# Patient Record
Sex: Male | Born: 1975 | Race: Black or African American | Hispanic: No | Marital: Married | Smoking: Never smoker
Health system: Southern US, Community
[De-identification: ages and names within clinical notes are randomized; demographics above are authoritative.]

---

## 2006-01-19 ENCOUNTER — Emergency Department (HOSPITAL_COMMUNITY): Admission: EM | Admit: 2006-01-19 | Discharge: 2006-01-19 | Payer: Self-pay | Admitting: Emergency Medicine

## 2006-11-03 ENCOUNTER — Emergency Department (HOSPITAL_COMMUNITY): Admission: EM | Admit: 2006-11-03 | Discharge: 2006-11-03 | Payer: Self-pay | Admitting: Family Medicine

## 2009-05-05 ENCOUNTER — Emergency Department (HOSPITAL_COMMUNITY): Admission: EM | Admit: 2009-05-05 | Discharge: 2009-05-05 | Payer: Self-pay | Admitting: Emergency Medicine

## 2009-05-14 ENCOUNTER — Emergency Department (HOSPITAL_COMMUNITY): Admission: EM | Admit: 2009-05-14 | Discharge: 2009-05-14 | Payer: Self-pay | Admitting: Family Medicine

## 2010-06-23 ENCOUNTER — Ambulatory Visit: Payer: Self-pay | Admitting: Family Medicine

## 2010-06-23 DIAGNOSIS — E669 Obesity, unspecified: Secondary | ICD-10-CM

## 2010-10-30 ENCOUNTER — Emergency Department (HOSPITAL_COMMUNITY)
Admission: EM | Admit: 2010-10-30 | Discharge: 2010-10-30 | Payer: Self-pay | Source: Home / Self Care | Admitting: Emergency Medicine
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
## 2010-12-29 ENCOUNTER — Encounter: Payer: Self-pay | Admitting: *Deleted

## 2011-01-25 ENCOUNTER — Encounter: Payer: Self-pay | Admitting: Family Medicine

## 2011-01-25 ENCOUNTER — Ambulatory Visit (INDEPENDENT_AMBULATORY_CARE_PROVIDER_SITE_OTHER): Payer: Commercial Managed Care - PPO | Admitting: Family Medicine

## 2011-01-25 DIAGNOSIS — J301 Allergic rhinitis due to pollen: Secondary | ICD-10-CM | POA: Insufficient documentation

## 2011-01-25 DIAGNOSIS — J309 Allergic rhinitis, unspecified: Secondary | ICD-10-CM

## 2011-01-25 DIAGNOSIS — Z9109 Other allergy status, other than to drugs and biological substances: Secondary | ICD-10-CM | POA: Insufficient documentation

## 2011-01-25 MED ORDER — FLUTICASONE PROPIONATE 50 MCG/ACT NA SUSP: 2.0000 | Freq: Every day | NASAL | Status: DC | PRN

## 2011-01-25 NOTE — Progress Notes (Signed)
  Subjective:    Patient ID: Edward Lane, male    DOB: 1976/08/20, 35 y.o.   MRN: [id number]  HPI   Every year approx this time has current symptoms of  nasal drainange, itchy eyes, +sneeze and mild productive cough which last for approx 2-3 weeks, in the past he has tried claritin, zyrtec, nyquil , sudafed which have not helped, he wanted to try allergy shots. Nasal drainage has been the worst " feels like a faucet"  This week used Sudafed which helped a lot, claritin and Ibuprofen for a HA,  Food allergies- would like a referral to be tested for allergies, he has noticed throat tightness and "weird" sensation if he eats apples or almonds, concerned about other allergies       Review of Systems  No fever, no emesis, no CP, no HA, no change in stools, no rash, no eye drainage, +sick contacts with children- URI/Bronchitis, OM     Objective:   Physical Exam  GEN- NAD, sounds hoarse, alert and oriented  HEENT- mild injection conjunctiva bilat, no cobblestoning, no discharge from eyes, PERRL, EOMI, enlarged pale turbinates- clear discharge, MMM, noted rough textured white mucosa bilat cheeks (pt chews cheek), fair dentition, no erythema oropharynx, TM clear bilat no fluid noted  Neck- small node anterior cervical CVS- RRR, no murmur RESP- CTAB Pulse- 2+       Assessment & Plan:

## 2011-01-25 NOTE — Assessment & Plan Note (Signed)
While he may have some viral URI intermixed, current symptoms more suggest of allergic rhinitis based on exam. Will start nasal corticosteroid as pt has not tried in the past RTC if no improvement prior to allergist visit

## 2011-01-25 NOTE — Assessment & Plan Note (Signed)
Will send for formal testing with allergist, pt has not had any recent difficulties with foods as he avoids apples,etc. Has not given history of any true anaphylaxtic events.

## 2011-01-25 NOTE — Patient Instructions (Signed)
Use the flonase as needed daily for allergic rhinitis You can use Sudafed, but do not use more than 3 days in a row We will call you with the allergist appt Please return if you do not improve

## 2011-02-07 ENCOUNTER — Encounter: Payer: Self-pay | Admitting: Family Medicine

## 2011-02-07 ENCOUNTER — Ambulatory Visit (INDEPENDENT_AMBULATORY_CARE_PROVIDER_SITE_OTHER): Payer: Commercial Managed Care - PPO | Admitting: Family Medicine

## 2011-02-07 DIAGNOSIS — J309 Allergic rhinitis, unspecified: Secondary | ICD-10-CM

## 2011-02-07 DIAGNOSIS — J029 Acute pharyngitis, unspecified: Secondary | ICD-10-CM

## 2011-02-07 NOTE — Patient Instructions (Signed)
Your strep test was negative today. I think your cough is probably related to your allergies.  Stop taking the Flonase.  You can try something like a Neti Pot to help rinse out your sinuses - this may help both your nasal symptoms and your cough.  Follow up with your allergist as scheduled. I hope this helps with your symptoms.   - Dr. Wallene Huh    Allergic Rhinitis Allergic rhinitis is when the mucous membranes in the nose respond to allergens. Allergens are particles in the air that cause your body to have an allergic reaction. This causes you to release allergic antibodies. Through a chain of events, these eventually cause you to release histamine into the blood stream (hence the use of antihistamines). Although meant to be protective to the body, it is this release that causes your discomfort, such as frequent sneezing, congestion and an itchy runny nose.  CAUSES The pollen allergens may come from grasses, trees, and weeds. This is seasonal allergic rhinitis, or "hay fever." Other allergens cause year-round allergic rhinitis (perennial allergic rhinitis) such as house dust mite allergen, pet dander and mold spores.  SYMPTOMS  Nasal stuffiness (congestion).   Runny, itchy nose with sneezing and tearing of the eyes.   There is often an itching of the mouth, eyes and ears.  It cannot be cured, but it can be controlled with medications. DIAGNOSIS If you are unable to determine the offending allergen, skin or blood testing may find it. TREATMENT  Avoid the allergen.   Medications and allergy shots (immunotherapy) can help.   Hay fever may often be treated with antihistamines in pill or nasal spray forms. Antihistamines block the effects of histamine. There are over-the-counter medicines that may help with nasal congestion and swelling around the eyes. Check with your caregiver before taking or giving this medicine.  If the treatment above does not work, there are many new medications your  caregiver can prescribe. Stronger medications may be used if initial measures are ineffective. Desensitizing injections can be used if medications and avoidance fails. Desensitization is when a patient is given ongoing shots until the body becomes less sensitive to the allergen. Make sure you follow up with your caregiver if problems continue. SEEK MEDICAL CARE IF:   You develop fever (more than 100.4F (38.1 C).   You develop a cough that does not stop easily (persistent).   You have shortness of breath.   You start wheezing.   Symptoms interfere with normal daily activities.  Document Released: 07/17/2001 Document Re-Released: 11/13/2009 [facility] Patient Information 2011 [place], [place].

## 2011-02-07 NOTE — Assessment & Plan Note (Signed)
Likely cause of sore throat. Has failed multiple OTC therapies, did not tolerate Flonase. Advised follow up with allergist. Advised nasal lavage. Advised chloraseptic for sore throat. Handout given. Follow up as needed.

## 2011-02-07 NOTE — Progress Notes (Signed)
  Subjective:    Patient ID: Edward Lane, male    DOB: 12-19-1975, 35 y.o.   MRN: [id number]  Sore Throat   Seen on 3/22 bu Dr. Jeanice Lim for allergic rhinitis symptoms - started on Flonase, scheduled for appointment with allergist due to history of seasonal allergies and question of food allergies (set up for 02/08/11). History of seasonal allergies chronically. Symptoms started about 3 weeks ago - nasal drainage and congestion, itchy eyes, cough, sore throat. Had tried Claritin, Zyrtec, Sudafed, Nyquil without relief. Tried Flonase prescribed by Dr. Jeanice Lim - states that it caused headache. Reports post-nasal drip. Denies heartburn, sour taste in mouth, chest pain, LE edema, fever, chills, nausea, emesis, diarrhea, body aches, lymphadenopathy. Reports +ve sick contacts (kids with URI sx).    Review of Systems as per HPI otherwise negative      Objective:   Physical Exam General: Pleasant, NAD, hoarse and congested sounding  Head: No sinus tenderness to palpation.  Eyes: Conjunctival injection bilaterally with tearing  Ears: Tympanic membranes clear bilaterally Nose: enlarged pale turbinates; congestion; clear discharge' Mouth: Moist membranes; mildly irritated buccal mucosa;  fair dentition; mild erythema without cobblestoning,  Neck: No anterior lymphadenopathy  Pulmonary: Clear to auscultation bilaterally without wheeze or stridor        Assessment & Plan:

## 2011-08-08 ENCOUNTER — Ambulatory Visit (INDEPENDENT_AMBULATORY_CARE_PROVIDER_SITE_OTHER): Payer: Commercial Managed Care - PPO | Admitting: Family Medicine

## 2011-08-08 VITALS — BP 113/78 | HR 92 | Temp 98.2°F | Wt 248.5 lb

## 2011-08-08 DIAGNOSIS — M62838 Other muscle spasm: Secondary | ICD-10-CM

## 2011-08-08 MED ORDER — KETOROLAC TROMETHAMINE 60 MG/2ML IM SOLN
60.0000 mg | Freq: Once | INTRAMUSCULAR | Status: AC
  Administered 2011-08-08: 60 mg via INTRAMUSCULAR

## 2011-08-08 MED ORDER — CYCLOBENZAPRINE HCL 5 MG PO TABS: ORAL_TABLET | ORAL | Status: DC

## 2011-08-08 MED ORDER — MELOXICAM 15 MG PO TABS: 15.0000 mg | ORAL_TABLET | Freq: Every day | ORAL | Status: DC

## 2011-08-08 NOTE — Progress Notes (Signed)
  Subjective:    Patient ID: Edward Lane, male    DOB: Mar 08, 1976, 35 y.o.   MRN: [id number]  HPI Lower back/gluteal pain x 2 days. Pt works at [employer] ,stands on [employer] the day. Pt states that he noticed low back/gluteal pain earlier in the day yesterday. Pt states that pain progressively worsended throughout the day to the point where he could not sit down or sleep last night. Pain worse with sitting and back flexion. No fever, rash, dysuria. Pain has some radiation down legs bilaterally. Pt states that this has been a recurrent issue with pt having similar episodes in neck and back. Pt does report decreased po intake secondary to work environment.   Review of Systems See HPI    Objective:   Physical Exam Gen: up in chair, NAD ABD: S/NT/+ bowel sounds  EXT: 2+ peripheral pulses MSK: + TTP in R gluteal region, + Pain in lower back with internal/external rotation of hips bilaterally.     Assessment & Plan:

## 2011-08-08 NOTE — Patient Instructions (Addendum)
It was good to meet you today I am starting you on a long acting NSAID for your pain  You can also use high dose tylenol (max of 3g daily) throughout the day as needed I am referring you to physical therapy as this has been a recurrent issue Call is you have any questions or concerns, or if your symptoms don't improve God Bless,  Doree Albee MD  Muscle Cramps Muscle cramps are due to sudden involuntary muscle contraction. This means you have no control over the tightening of a muscle (or muscles). Often there are no obvious causes. Muscle cramps may occur with over-exertion. They may also occur with chilling of the muscles. An example of a muscle chilling activity is swimming. It is uncommon for cramps to be due to a serious underlying disorder. In most cases, muscle cramps improve (or leave) within minutes. CAUSES Some common causes are due to:  Injury.   Infections, especially viral.   Abnormal levels of the salts and ions in your blood (electrolytes). This could happen if you are taking water pills (diuretics).   Blood vessel disease where not enough blood is getting to the muscles (intermittent claudication).  SOME UNCOMMON CAUSES OF MUSCLE CRAMPS ARE:  Side effects of some medicine (such as lithium).   Alcohol abuse.   Diseases where there is soreness (inflammation) of the muscular system.  HOME CARE INSTRUCTIONS  It may be helpful to massage, stretch, and relax the affected muscle.   Taking a dose of over-the-counter Benadryl (dephenhydramine) is helpful for night leg cramps.   Tonic water that contains quinine may be helpful.  SEEK MEDICAL CARE IF:  Cramps are frequent and not relieved with medicine.  MAKE SURE YOU:   Understand these instructions.   Will watch your condition.   Will get help right away if you are not doing well or get worse.  Document Released: 04/13/2002 Document Re-Released: 01/18/2009 [facility] Patient Information 2011 [place], [place].

## 2011-08-08 NOTE — Assessment & Plan Note (Addendum)
Lower back muscle spasm with major piriformis involvement. toradol 60 in clinic today. Will start pt on mobic with prn tylenol. Prn flexeril at night for recalcitrant spasm. Given that this has been a recurrent issue, will also set up pt for physical therapy. Handout given. Red flags for return discussed.

## 2012-02-18 ENCOUNTER — Ambulatory Visit (INDEPENDENT_AMBULATORY_CARE_PROVIDER_SITE_OTHER): Payer: Commercial Managed Care - PPO | Admitting: Family Medicine

## 2012-02-18 ENCOUNTER — Encounter: Payer: Self-pay | Admitting: Family Medicine

## 2012-02-18 VITALS — BP 116/80 | HR 64 | Temp 97.9°F | Ht 72.0 in | Wt 256.0 lb

## 2012-02-18 DIAGNOSIS — J301 Allergic rhinitis due to pollen: Secondary | ICD-10-CM

## 2012-02-18 MED ORDER — CETIRIZINE HCL 10 MG PO TABS: 10.0000 mg | ORAL_TABLET | Freq: Every day | ORAL | Status: DC

## 2012-02-18 MED ORDER — FLUTICASONE PROPIONATE 50 MCG/ACT NA SUSP: 2.0000 | Freq: Every day | NASAL | Status: DC

## 2012-02-18 NOTE — Patient Instructions (Signed)
It was nice meeting you today.  Your symptoms are most likely due to seasonal allergies. I don't see any sign of infection.  You can try taking zyrtec or cetirizine, once daily at nighttime. I am also going to send a prescription for flonase which is a nasal steroid. You can use the nasal saline first, then use the nasal steroid after that.   If you start having fevers, chills, worsening symptoms, please come back to the clinic to make sure that there is not a superimposed infection.

## 2012-02-19 NOTE — Assessment & Plan Note (Signed)
No evidence of infection at this time. Patient's rhinorrhea and itchy eyes are most likely due to seasonal allergies. Recommended zyrtec at night time and prescribed flonase. Also recommended saline nasal solution for irrigation.  Went over red flags (see AVS) and gave handout on allergic rhinitis.

## 2012-02-19 NOTE — Progress Notes (Signed)
Patient ID: Edward Lane, male   DOB: 1976/10/19, 36 y.o.   MRN: [id number] Patient ID: Edward Lane    DOB: 10/25/1976, 36 y.o.   MRN: [id number] --- Subjective:  Edward Lane is a 36 y.o.male who presents with seasonal allergy symptoms x5 days. He complains of rhinorrhea, itchy eyes, headache around eyes, and an occasional cough. He denies any fever or chills, sore throat, ear pain, or sick contacts. He denies tobacco use or exposure. He has been using a multi-allergy medicine that contains Tylenol and Sudafed and feels like he has had mild relief. This is a recurrent problem. He has had seasonal allergies in the past mostly in the spring and fall around the change in season. He has tried Musician and Benadryl which all did not work. He has never tried Zyrtec or nasal sprays.   Objective: Filed Vitals:   02/18/12 1024  BP: 116/80  Pulse: 64  Temp: 97.9 F (36.6 C)    Physical Examination:   General appearance - alert, well appearing, and in no distress Ears - right TM slightly duller than left, but no pus or erythema observed.  Nose - inflamed nasal turbinates Mouth - mucous membranes moist, pharynx normal without lesions Neck - supple, no significant adenopathy Chest - clear to auscultation, no wheezes, rales or rhonchi, symmetric air entry Heart - normal rate, regular rhythm, normal S1, S2, no murmurs, rubs, clicks or gallops

## 2012-11-07 ENCOUNTER — Encounter: Payer: Commercial Managed Care - PPO | Admitting: Family Medicine

## 2013-03-03 ENCOUNTER — Emergency Department (HOSPITAL_COMMUNITY)
Admission: EM | Admit: 2013-03-03 | Discharge: 2013-03-03 | Disposition: A | Discharge status: Home / Self Care | Payer: No Typology Code available for payment source | Attending: Emergency Medicine | Admitting: Emergency Medicine

## 2013-03-03 ENCOUNTER — Encounter (HOSPITAL_COMMUNITY): Payer: Self-pay | Admitting: Emergency Medicine

## 2013-03-03 DIAGNOSIS — M549 Dorsalgia, unspecified: Secondary | ICD-10-CM

## 2013-03-03 DIAGNOSIS — Y9389 Activity, other specified: Secondary | ICD-10-CM | POA: Insufficient documentation

## 2013-03-03 DIAGNOSIS — M5380 Other specified dorsopathies, site unspecified: Secondary | ICD-10-CM | POA: Insufficient documentation

## 2013-03-03 DIAGNOSIS — IMO0002 Reserved for concepts with insufficient information to code with codable children: Secondary | ICD-10-CM | POA: Insufficient documentation

## 2013-03-03 DIAGNOSIS — M62830 Muscle spasm of back: Secondary | ICD-10-CM

## 2013-03-03 DIAGNOSIS — Y92410 Unspecified street and highway as the place of occurrence of the external cause: Secondary | ICD-10-CM | POA: Insufficient documentation

## 2013-03-03 MED ORDER — CYCLOBENZAPRINE HCL 10 MG PO TABS: 10.0000 mg | ORAL_TABLET | Freq: Two times a day (BID) | ORAL | Status: DC | PRN

## 2013-03-03 MED ORDER — IBUPROFEN 800 MG PO TABS
800.0000 mg | ORAL_TABLET | Freq: Once | ORAL | Status: AC
  Administered 2013-03-03: 800 mg via ORAL
  Filled 2013-03-03: qty 1

## 2013-03-03 MED ORDER — IBUPROFEN 800 MG PO TABS: 800.0000 mg | ORAL_TABLET | Freq: Three times a day (TID) | ORAL | Status: DC

## 2013-03-03 MED ORDER — CYCLOBENZAPRINE HCL 10 MG PO TABS
10.0000 mg | ORAL_TABLET | Freq: Once | ORAL | Status: AC
  Administered 2013-03-03: 10 mg via ORAL
  Filled 2013-03-03: qty 1
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
## 2013-03-04 ENCOUNTER — Ambulatory Visit (INDEPENDENT_AMBULATORY_CARE_PROVIDER_SITE_OTHER): Payer: BC Managed Care – PPO | Admitting: Family Medicine

## 2013-03-04 ENCOUNTER — Encounter: Payer: Self-pay | Admitting: Family Medicine

## 2013-03-04 VITALS — BP 130/91 | HR 70 | Temp 98.0°F | Ht 72.0 in | Wt 250.0 lb

## 2013-03-04 DIAGNOSIS — M542 Cervicalgia: Secondary | ICD-10-CM | POA: Insufficient documentation

## 2013-03-04 DIAGNOSIS — E669 Obesity, unspecified: Secondary | ICD-10-CM

## 2013-03-04 NOTE — Patient Instructions (Signed)
Based off your exam and history I do not think you need cervical films today. Continue medications as prescribed by ED. Continue range of motion exercises. Massage by your wife would be helpful as well.   These would be reasons to return: weakness in arm or legs, bowel or bladder incontinence, numbness/tingling in hands or legs, worsening pain.   Follow up within the month with Dr. Mauricio Po for yearly physical and to follow up blood pressure,  Dr. Durene Cal  Health Maintenance Due-looks like tetanus is the only thing you need  Topic Date Due  . Tetanus/tdap  05/12/1994  . Influenza Vaccine  07/06/2013
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
## 2013-03-05 ENCOUNTER — Telehealth: Payer: Self-pay | Admitting: *Deleted

## 2013-03-05 NOTE — ED Provider Notes (Signed)
I have personally seen and examined the patient.  I have discussed the plan of care with the resident.  I have reviewed the documentation on PMH/FH/Soc. History.  I have reviewed the documentation of the resident and agree.  Amyia Lodwick, MD 03/05/13 1440 
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
## 2013-03-13 ENCOUNTER — Ambulatory Visit (INDEPENDENT_AMBULATORY_CARE_PROVIDER_SITE_OTHER): Payer: BC Managed Care – PPO | Admitting: Family Medicine

## 2013-03-13 VITALS — BP 116/81 | HR 73 | Temp 98.3°F | Ht 72.0 in | Wt 253.0 lb

## 2013-03-13 DIAGNOSIS — M542 Cervicalgia: Secondary | ICD-10-CM
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
## 2013-03-13 NOTE — Patient Instructions (Signed)
Things are looking better today. I am glad your range of motion is improving. Keep moving!  See me or Dr. Mauricio Po in 2 weeks to follow up to reassess to see if we can get you back to full time.   Thanks, Dr. Durene Cal

## 2013-03-16 ENCOUNTER — Encounter: Payer: Self-pay | Admitting: Family Medicine
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
## 2013-03-27 ENCOUNTER — Ambulatory Visit (INDEPENDENT_AMBULATORY_CARE_PROVIDER_SITE_OTHER): Payer: BC Managed Care – PPO | Admitting: Family Medicine

## 2013-03-27 ENCOUNTER — Encounter: Payer: Self-pay | Admitting: Family Medicine

## 2013-03-27 VITALS — BP 125/88 | HR 76 | Ht 72.0 in | Wt 251.0 lb

## 2013-03-27 DIAGNOSIS — R51 Headache: Secondary | ICD-10-CM

## 2013-03-27 DIAGNOSIS — R519 Headache, unspecified: Secondary | ICD-10-CM | POA: Insufficient documentation

## 2013-03-27 MED ORDER — NORTRIPTYLINE HCL 50 MG PO CAPS: 50.0000 mg | ORAL_CAPSULE | Freq: Every day | ORAL | Status: DC
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
## 2013-03-27 NOTE — Patient Instructions (Addendum)
It was a pleasure to see you today.    For your headache, I am changing your medicine (stop the Flexeril) to NORTRIPTYLINE 50MG  ONE TIME DAILY AT BEDTIME. This should help you sleep well, and help reduce the intensity and frequency of the headache.   I would like to see you back in 2 weeks for follow up.  I have written a letter for your work today; will update at next visit.
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
## 2013-04-10 ENCOUNTER — Ambulatory Visit (INDEPENDENT_AMBULATORY_CARE_PROVIDER_SITE_OTHER): Payer: BC Managed Care – PPO | Admitting: Family Medicine

## 2013-04-10 ENCOUNTER — Encounter: Payer: Self-pay | Admitting: Family Medicine

## 2013-04-10 VITALS — BP 122/81 | HR 74 | Ht 72.0 in | Wt 260.0 lb

## 2013-04-10 DIAGNOSIS — M545 Low back pain, unspecified: Secondary | ICD-10-CM

## 2013-04-10 DIAGNOSIS — R51 Headache: Secondary | ICD-10-CM
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
## 2013-04-10 NOTE — Progress Notes (Signed)
  Subjective:    Patient ID: Edward Lane, male    DOB: June 07, 1976, 37 y.o.   MRN: [id number]  HPI Here for followup on headaches.  Since stopping the flexeril and starting the nortriptyline, he has slept better.  If he takes the nortriptyline too late (around 11pm), he will have after-effects in the morning at [facility] when he wakes up.  The intensity of headaches is improving. May have one headache a day, tolerable.  Not taking otc pain relievers for this.  No associated photophobia or phonophobia; no nausea or vomiting.  Headache used to be on the L parietal area, now moved to the right.  Not temporal, no associated ear pain or frontal/maxillary pain.  He continues to work 4 hour shifts, has been asked to come back full-time (8 hours at a time).  Believes he would do better to ease back into his full time routine with a transition to 6-hour shifts first for 2 weeks.  At work he has significant lower back pain after 4 hours.   Review of Systems See above    Objective:   Physical Exam Well appearing, no apparent distress HEENT Neck supple. TMs clear bilat.  No findings on examination of scalp.  EOMI.  PERRL.  No frontal or maxillary sinus tenderness. No cervical adenopathy noted.  MSK: No point tenderness over vertebral processes. Mild paraspinous tenderness in lumbar region bilaterally. Gait unremarkable.        Assessment & Plan:

## 2013-04-10 NOTE — Assessment & Plan Note (Signed)
Improving in frequency and intensity since starting nortriptyline 50mg  nightly. He admits that he does not take it every night, perhaps 4 out of 7 nights.  Plans to reduce/eliminate the med as his headaches dissipate.  To call for further evaluation if HAs get worse/change in character.  He is not using OTC pain relievers, we discussed 'medication overuse headaches" and he does not appear to be at risk for this now.

## 2013-04-10 NOTE — Patient Instructions (Signed)
It was a pleasure to see you today.    For the headaches, we will allow the nortriptyline to run out as your needs for it diminish.  If your headaches come back or change in character, please be in touch with me.   Note for employer regarding increasing work hours before going back to full time.

## 2013-05-05 ENCOUNTER — Ambulatory Visit (INDEPENDENT_AMBULATORY_CARE_PROVIDER_SITE_OTHER): Payer: BC Managed Care – PPO | Admitting: Family Medicine

## 2013-05-05 ENCOUNTER — Encounter: Payer: Self-pay | Admitting: Family Medicine

## 2013-05-05 VITALS — BP 117/81 | HR 74 | Ht 72.0 in | Wt 256.0 lb

## 2013-05-05 DIAGNOSIS — R51 Headache: Secondary | ICD-10-CM
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
## 2013-05-05 NOTE — Patient Instructions (Addendum)
It was a pleasure to see you today.  Please take the nortriptyline 50mg  capsules, one before bedtime, every night until I see you back in 2 weeks.   I am ordering a CT scan of the head to be sure we are not missing another cause.   I would like to see you back in the office in about 2 weeks' time.   Note for your work place.

## 2013-05-11 ENCOUNTER — Telehealth: Payer: Self-pay | Admitting: Family Medicine

## 2013-05-11 NOTE — Telephone Encounter (Signed)
Called pt. He does not know if he needs PA, or where to call. He said to set up his

## 2013-05-11 NOTE — Telephone Encounter (Signed)
Pt is returning call about pre-auth on CT JW
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
## 2013-05-12 NOTE — Telephone Encounter (Signed)
Left detailed message on patients voicemail. Draylen Lobue S  

## 2013-05-14 ENCOUNTER — Telehealth: Payer: Self-pay | Admitting: Family Medicine

## 2013-05-14 NOTE — Telephone Encounter (Signed)
Pt is calling to let Dr. Mauricio Po know that the medication he is taking has left him with some side effects so that he has not been to work since this past Tuesday. He is wondering what he should do. He would like to talk to Dr. Mauricio Po or a nurse on how to proceed. JW

## 2013-05-14 NOTE — Telephone Encounter (Signed)
Called pt and he reports that he has not been working since 05/11/13 due to feeling drowsy from taking the nortriptyline at night. It has helped some with his head aches, but he can't work due to feeling drowsy. Pt supposed to take it for 2 weeks. His question is: should he stop the nortriptyline so he can go back to work, or should he stay out until he sees Dr.Breen again on 05/22/13. Pt has appt for CT scan tomorrow. Will fwd. To Dr.Breen for review. Lorenda Hatchet, Renato Battles

## 2013-05-14 NOTE — Telephone Encounter (Signed)
called pt.lmvm to schedule OV with Dr.Breen. See last OV (needs to f/up)

## 2013-05-15 ENCOUNTER — Ambulatory Visit
Admission: RE | Admit: 2013-05-15 | Discharge: 2013-05-15 | Disposition: A | Discharge status: Home / Self Care | Payer: No Typology Code available for payment source | Source: Ambulatory Visit | Attending: Family Medicine | Admitting: Family Medicine

## 2013-05-15 DIAGNOSIS — R51 Headache: Secondary | ICD-10-CM

## 2013-05-15 NOTE — Telephone Encounter (Signed)
LMVM to call back. Please see Dr.Breen's message. Thank you. Lorenda Hatchet, Renato Battles

## 2013-05-15 NOTE — Telephone Encounter (Signed)
I recommend he take the nortriptyline 1-2 hours earlier each night, for the next 2-3 nights, to see if the effects of sleepiness wear off in the mornings.  If this does not help, he may hold the medicine until our visit, at which time we can consider other medication options (such as Inderal).  I will be on the lookout for his CT results. JB

## 2013-05-19 ENCOUNTER — Telehealth: Payer: Self-pay | Admitting: *Deleted

## 2013-05-19 NOTE — Telephone Encounter (Signed)
Pt called and I explained Dr.Breen's message to him. He verbalized understanding. He said, that he would d/c the nortriptyline because it is not working for him due to being unable to work.  Pt request to be written out of work for 7/8, 7/9, 7/10, 7/11, 7/14 and 7/15. He needs a note stating that he can go back to work 7/16. Pt also request for Dr.Breen to call him re: CT scan, today if possible. I told the pt that I would send his request to Dr.Breen. Lorenda Hatchet, Renato Battles

## 2013-05-20 ENCOUNTER — Telehealth: Payer: Self-pay | Admitting: Family Medicine

## 2013-05-20 NOTE — Telephone Encounter (Signed)
Pt says he received a phone message from dr breen saying for him to call dr back about CT scan that was done last Friday. No phone notes in file indicating pt was called. Pt has appt with dr Mauricio Po on Friday July 18. Please advise

## 2013-05-20 NOTE — Telephone Encounter (Signed)
I called patient back to cell phone, left voice message.  I left him the message that his CT scan does not show abnormalities to explain HA.  I also left the message that I wish to understand better the issue regarding his request for a work excuse, asked that he call me back. JB

## 2013-05-20 NOTE — Telephone Encounter (Signed)
Will fwd to Md.  Thurley Francesconi L, CMA  
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
## 2013-05-22 ENCOUNTER — Ambulatory Visit (INDEPENDENT_AMBULATORY_CARE_PROVIDER_SITE_OTHER): Payer: BC Managed Care – PPO | Admitting: Family Medicine

## 2013-05-22 ENCOUNTER — Encounter: Payer: Self-pay | Admitting: Family Medicine

## 2013-05-22 VITALS — BP 118/85 | HR 101 | Temp 98.4°F | Ht 72.0 in | Wt 258.9 lb

## 2013-05-22 DIAGNOSIS — R51 Headache: Secondary | ICD-10-CM

## 2013-05-22 DIAGNOSIS — M542 Cervicalgia: Secondary | ICD-10-CM

## 2013-05-22 NOTE — Patient Instructions (Addendum)
It was a pleasure to see you again today.   For your neck pain/headache, we will hold all regular medicine for this.  Start with Physical Therapy and Massage Therapy.   A cervical x-ray was ordered today as well.  I would like to see you back in the coming month for follow up.   Letter for work was written.
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
## 2013-05-25 ENCOUNTER — Ambulatory Visit
Admission: RE | Admit: 2013-05-25 | Discharge: 2013-05-25 | Disposition: A | Discharge status: Home / Self Care | Payer: No Typology Code available for payment source | Source: Ambulatory Visit | Attending: Family Medicine | Admitting: Family Medicine

## 2013-05-25 ENCOUNTER — Encounter: Payer: Self-pay | Admitting: Family Medicine

## 2013-05-25 DIAGNOSIS — M542 Cervicalgia: Secondary | ICD-10-CM

## 2013-06-01 ENCOUNTER — Ambulatory Visit: Payer: Self-pay | Admitting: Physical Therapy

## 2013-06-01 ENCOUNTER — Ambulatory Visit: Payer: No Typology Code available for payment source | Attending: Family Medicine | Admitting: Physical Therapy

## 2013-06-01 DIAGNOSIS — R51 Headache: Secondary | ICD-10-CM | POA: Insufficient documentation

## 2013-06-01 DIAGNOSIS — M542 Cervicalgia: Secondary | ICD-10-CM | POA: Insufficient documentation

## 2013-06-01 DIAGNOSIS — IMO0001 Reserved for inherently not codable concepts without codable children: Secondary | ICD-10-CM | POA: Insufficient documentation

## 2013-06-02 ENCOUNTER — Telehealth: Payer: Self-pay | Admitting: Family Medicine

## 2013-06-02 NOTE — Telephone Encounter (Signed)
Called pt. He reports that his physical therapist told him, if she works on his neck and if he still does his job (which requires to hold down his neck) the PT would not work, it would be counter -productive. Pt has to avoid holding down his neck.  I told the pt, that I don't think, that Dr.Breen would write him out of work while in PT. Pt said, that maybe he needs a letter for restrictions on his job.    We  also need for the PT to send her recommendations to Dr.Breen. Pt said, that Dr.Breen should have the report already. Lorenda Hatchet, Renato Battles

## 2013-06-02 NOTE — Telephone Encounter (Signed)
Patient is calling about Physical Therapy starting yesterday and the recommendations for work.

## 2013-06-02 NOTE — Telephone Encounter (Signed)
I did not see any PT notes in the Epic chart, and I did not have any PT paper notes in my paper mailbox earlier today.  I will need these recs from his treating physical therapist in order to write a letter for work restrictions.  Paula Compton, MD

## 2013-06-03 NOTE — Telephone Encounter (Signed)
Called patient and told him he needs to have the records faxed to [place] form PT for Dr Mauricio Po to review before he would consider writing a work restrictions letter.[name], [name]

## 2013-06-03 NOTE — Telephone Encounter (Signed)
I received a report from the [facility] Physical Therapy department, which they completed after his evaluation on July 28th, 2014.  There is no mention of having him restrict his work activities as part of the therapeutic plan. JB

## 2013-06-04 ENCOUNTER — Ambulatory Visit: Payer: No Typology Code available for payment source | Admitting: Physical Therapy

## 2013-06-04 ENCOUNTER — Telehealth: Payer: Self-pay | Admitting: Family Medicine

## 2013-06-04 NOTE — Telephone Encounter (Signed)
Pt came by. He is doing PT.  Dr Mauricio Po was waiting on recommendation from physical therapist in regards to work. PT: Boneta Lucks at [facility]. Boneta Lucks is not sure what her role and what she can say in her recommendation. Please have Dr Mauricio Po contact Boneta Lucks.

## 2013-06-08 ENCOUNTER — Ambulatory Visit: Payer: No Typology Code available for payment source | Attending: Family Medicine | Admitting: Physical Therapy

## 2013-06-08 DIAGNOSIS — R51 Headache: Secondary | ICD-10-CM | POA: Insufficient documentation

## 2013-06-08 DIAGNOSIS — IMO0001 Reserved for inherently not codable concepts without codable children: Secondary | ICD-10-CM | POA: Insufficient documentation

## 2013-06-08 DIAGNOSIS — M542 Cervicalgia: Secondary | ICD-10-CM | POA: Insufficient documentation

## 2013-06-09 ENCOUNTER — Ambulatory Visit: Payer: No Typology Code available for payment source | Admitting: Physical Therapy

## 2013-06-11 ENCOUNTER — Ambulatory Visit: Payer: No Typology Code available for payment source | Admitting: Physical Therapy

## 2013-06-15 ENCOUNTER — Ambulatory Visit: Payer: No Typology Code available for payment source | Admitting: Physical Therapy

## 2013-06-16 ENCOUNTER — Encounter: Payer: Self-pay | Admitting: Physical Therapy

## 2013-06-18 ENCOUNTER — Ambulatory Visit: Payer: No Typology Code available for payment source

## 2013-06-18 ENCOUNTER — Ambulatory Visit: Payer: No Typology Code available for payment source | Admitting: Physical Therapy

## 2013-08-07 ENCOUNTER — Ambulatory Visit: Payer: Self-pay | Admitting: Family Medicine

## 2013-08-08 ENCOUNTER — Emergency Department (INDEPENDENT_AMBULATORY_CARE_PROVIDER_SITE_OTHER)
Admission: EM | Admit: 2013-08-08 | Discharge: 2013-08-08 | Disposition: A | Discharge status: Nursing Home (Includes ICF) | Payer: BC Managed Care – PPO | Source: Home / Self Care | Attending: Family Medicine | Admitting: Family Medicine

## 2013-08-08 ENCOUNTER — Emergency Department (HOSPITAL_COMMUNITY): Payer: BC Managed Care – PPO

## 2013-08-08 ENCOUNTER — Inpatient Hospital Stay (HOSPITAL_COMMUNITY)
Admission: EM | Admit: 2013-08-08 | Discharge: 2013-08-09 | DRG: 178 | Disposition: A | Discharge status: Home / Self Care | Payer: BC Managed Care – PPO | Attending: Family Medicine | Admitting: Family Medicine

## 2013-08-08 ENCOUNTER — Other Ambulatory Visit: Payer: Self-pay

## 2013-08-08 ENCOUNTER — Encounter (HOSPITAL_COMMUNITY): Payer: Self-pay | Admitting: Emergency Medicine

## 2013-08-08 DIAGNOSIS — R112 Nausea with vomiting, unspecified: Secondary | ICD-10-CM | POA: Diagnosis present

## 2013-08-08 DIAGNOSIS — R1013 Epigastric pain: Secondary | ICD-10-CM | POA: Diagnosis present

## 2013-08-08 DIAGNOSIS — K269 Duodenal ulcer, unspecified as acute or chronic, without hemorrhage or perforation: Principal | ICD-10-CM | POA: Diagnosis present

## 2013-08-08 DIAGNOSIS — R1110 Vomiting, unspecified: Secondary | ICD-10-CM

## 2013-08-08 DIAGNOSIS — R52 Pain, unspecified: Secondary | ICD-10-CM

## 2013-08-08 DIAGNOSIS — K8020 Calculus of gallbladder without cholecystitis without obstruction: Secondary | ICD-10-CM | POA: Diagnosis present

## 2013-08-08 LAB — CBC WITH DIFFERENTIAL/PLATELET
Basophils Absolute: 0 10*3/uL (ref 0.0–0.1)
Basophils Relative: 0 % (ref 0–1)
Eosinophils Relative: 2 % (ref 0–5)
HCT: 39.2 % (ref 39.0–52.0)
MCH: 29.2 pg (ref 26.0–34.0)
MCHC: 36.5 g/dL — ABNORMAL HIGH (ref 30.0–36.0)
Monocytes Absolute: 0.6 10*3/uL (ref 0.1–1.0)
Neutro Abs: 6.6 10*3/uL (ref 1.7–7.7)
Neutrophils Relative %: 70 % (ref 43–77)
Platelets: 298 10*3/uL (ref 150–400)
RDW: 12.2 % (ref 11.5–15.5)

## 2013-08-08 LAB — HEPATIC FUNCTION PANEL
ALT: 17 U/L (ref 0–53)
Alkaline Phosphatase: 104 U/L (ref 39–117)
Total Protein: 7.9 g/dL (ref 6.0–8.3)

## 2013-08-08 LAB — BASIC METABOLIC PANEL
Calcium: 9.4 mg/dL (ref 8.4–10.5)
Chloride: 102 mEq/L (ref 96–112)
Creatinine, Ser: 0.94 mg/dL (ref 0.50–1.35)
GFR calc Af Amer: >90 mL/min (ref >90)
Sodium: 139 mEq/L (ref 135–145)

## 2013-08-08 LAB — OCCULT BLOOD, POC DEVICE: Fecal Occult Bld: NEGATIVE

## 2013-08-08 LAB — LIPASE, BLOOD: Lipase: 46 U/L (ref 11–59)

## 2013-08-08 MED ORDER — ONDANSETRON HCL 4 MG/2ML IJ SOLN
4.0000 mg | Freq: Once | INTRAMUSCULAR | Status: AC
  Administered 2013-08-08: 4 mg via INTRAVENOUS
  Filled 2013-08-08: qty 2

## 2013-08-08 MED ORDER — ONDANSETRON HCL 4 MG/2ML IJ SOLN: 4.0000 mg | Freq: Four times a day (QID) | INTRAMUSCULAR | Status: DC | PRN

## 2013-08-08 MED ORDER — GI COCKTAIL ~~LOC~~: 30.0000 mL | Freq: Once | ORAL | Status: DC

## 2013-08-08 MED ORDER — ONDANSETRON HCL 4 MG PO TABS: 4.0000 mg | ORAL_TABLET | Freq: Four times a day (QID) | ORAL | Status: DC | PRN

## 2013-08-08 MED ORDER — SODIUM CHLORIDE 0.9 % IV BOLUS (SEPSIS)
1000.0000 mL | Freq: Once | INTRAVENOUS | Status: AC
  Administered 2013-08-08: 1000 mL via INTRAVENOUS

## 2013-08-08 MED ORDER — IOHEXOL 300 MG/ML  SOLN
25.0000 mL | Freq: Once | INTRAMUSCULAR | Status: AC | PRN
  Administered 2013-08-08: 25 mL via ORAL

## 2013-08-08 MED ORDER — PANTOPRAZOLE SODIUM 40 MG IV SOLR
40.0000 mg | Freq: Two times a day (BID) | INTRAVENOUS | Status: DC
  Administered 2013-08-08 – 2013-08-09 (×2): 40 mg via INTRAVENOUS
  Filled 2013-08-08 (×3): qty 40

## 2013-08-08 MED ORDER — HYDROMORPHONE HCL PF 2 MG/ML IJ SOLN
2.0000 mg | Freq: Once | INTRAMUSCULAR | Status: AC
  Administered 2013-08-08: 2 mg via INTRAVENOUS
  Filled 2013-08-08: qty 1

## 2013-08-08 MED ORDER — HYDROMORPHONE HCL PF 1 MG/ML IJ SOLN: 1.0000 mg | INTRAMUSCULAR | Status: DC | PRN

## 2013-08-08 MED ORDER — IOHEXOL 300 MG/ML  SOLN
100.0000 mL | Freq: Once | INTRAMUSCULAR | Status: AC | PRN
  Administered 2013-08-08: 100 mL via INTRAVENOUS

## 2013-08-08 MED ORDER — FAMOTIDINE IN NACL 20-0.9 MG/50ML-% IV SOLN
20.0000 mg | Freq: Once | INTRAVENOUS | Status: AC
  Administered 2013-08-08: 20 mg via INTRAVENOUS
  Filled 2013-08-08: qty 50

## 2013-08-08 MED ORDER — MORPHINE SULFATE 4 MG/ML IJ SOLN
4.0000 mg | Freq: Once | INTRAMUSCULAR | Status: AC
  Administered 2013-08-08: 4 mg via INTRAVENOUS
  Filled 2013-08-08: qty 1

## 2013-08-08 MED ORDER — KETOROLAC TROMETHAMINE 30 MG/ML IJ SOLN
30.0000 mg | Freq: Three times a day (TID) | INTRAMUSCULAR | Status: DC
  Filled 2013-08-08 (×2): qty 1

## 2013-08-08 MED ORDER — HEPARIN SODIUM (PORCINE) 5000 UNIT/ML IJ SOLN
5000.0000 [IU] | Freq: Three times a day (TID) | INTRAMUSCULAR | Status: DC
  Administered 2013-08-08 – 2013-08-09 (×2): 5000 [IU] via SUBCUTANEOUS
  Filled 2013-08-08 (×5): qty 1

## 2013-08-08 MED ORDER — PROMETHAZINE HCL 25 MG/ML IJ SOLN
12.5000 mg | Freq: Once | INTRAMUSCULAR | Status: AC
  Administered 2013-08-08: 12.5 mg via INTRAVENOUS
  Filled 2013-08-08: qty 1

## 2013-08-08 MED ORDER — SODIUM CHLORIDE 0.45 % IV SOLN
INTRAVENOUS | Status: DC
  Administered 2013-08-08 – 2013-08-09 (×2): via INTRAVENOUS

## 2013-08-08 NOTE — ED Provider Notes (Signed)
CSN: [id number]     Arrival date & time 08/08/13  4098 History   First MD Initiated Contact with Patient 08/08/13 [phone]     Chief Complaint  Patient presents with  . Abdominal Pain   (Consider location/radiation/quality/duration/timing/severity/associated sxs/prior Treatment) Patient is a 37 y.o. male presenting with abdominal pain. The history is provided by the patient.  Abdominal Pain This is a new problem. The current episode started more than 1 week ago (no eton or nsaids, no prior hx of gerd or abd pain, nl bm, occas loose stool, mod nausea, no vomiting, no radiation to back.). The problem has been gradually worsening. Associated symptoms include abdominal pain.    History reviewed. No pertinent past medical history. History reviewed. No pertinent past surgical history. No family history on file. History  Substance Use Topics  . Smoking status: Never Smoker   . Smokeless tobacco: Never Used  . Alcohol Use: No    Review of Systems  Constitutional: Negative.   HENT: Negative.   Cardiovascular: Negative.   Gastrointestinal: Positive for nausea and abdominal pain. Negative for vomiting, diarrhea, constipation and blood in stool.    Allergies  Apple  Home Medications   Current Outpatient Rx  Name  Route  Sig  Dispense  Refill  . ibuprofen (ADVIL,MOTRIN) 800 MG tablet   Oral   Take 1 tablet (800 mg total) by mouth 3 (three) times daily.   30 tablet   0    BP 121/84  Pulse 76  Temp(Src) 98 F (36.7 C) (Oral)  Resp 18  SpO2 97% Physical Exam  Nursing note and vitals reviewed. Constitutional: He is oriented to person, place, and time. He appears well-developed and well-nourished. He appears distressed.  HENT:  Mouth/Throat: Oropharynx is clear and moist.  Neck: Normal range of motion. Neck supple.  Cardiovascular: Normal rate, regular rhythm and intact distal pulses.   Pulmonary/Chest: Effort normal and breath sounds normal.  Abdominal: Soft. Normal appearance.  He exhibits no distension, no pulsatile midline mass and no mass. Bowel sounds are increased. There is tenderness in the epigastric area. There is no rebound, no guarding, no CVA tenderness, no tenderness at McBurney's point and negative Murphy's sign. No hernia.    Neurological: He is alert and oriented to person, place, and time.  Skin: Skin is warm and dry.    ED Course  Procedures (including critical care time) Labs Review Labs Reviewed - No data to display Imaging Review No results found.  MDM   1. Abdominal pain, acute, epigastric    Sent for eval of midline upper abd pain ,sx for 2 weeks, worse x 3 d, consider nonetoh pancreatitis.    Linna Hoff, MD 08/08/13 1001

## 2013-08-08 NOTE — Progress Notes (Signed)
RN called 3 times- phone continued to ring with no pick up. On first try, RN was transferred to the appropriate nurse for report but no one answered.  Awaiting call back.

## 2013-08-08 NOTE — Progress Notes (Signed)
  Pt admitted to the unit. Pt is stable, alert and oriented per baseline. Oriented to room, staff, and call bell. Educated to call for any assistance. Bed in lowest position, call bell within reach- will continue to monitor. 

## 2013-08-08 NOTE — ED Provider Notes (Signed)
Medical screening examination/treatment/procedure(s) were conducted as a shared visit with non-physician practitioner(s) and myself.  I personally evaluated the patient during the encounter  Pt presents w/ worsening epigastric/RUQ pain, improved w/ PO intake.  On PE +ttp epigastrium/RUQ q/o rebound or guarding. [place] w/ 1 stone, no GB wall thickening, no pericholecystic fluid, mild inc GB size. CT ab/pelvis w/o acute findings.  Concern for duodenal ulcer vs cholelithiasis.  Pt started on PPI, will f/u with surg as outpt  Shanna Cisco, MD 08/08/13 1725

## 2013-08-08 NOTE — ED Provider Notes (Signed)
CSN: [id number]     Arrival date & time 08/08/13  1009 History   First MD Initiated Contact with Patient 08/08/13 1013     Chief Complaint  Patient presents with  . Abdominal Pain   (Consider location/radiation/quality/duration/timing/severity/associated sxs/prior Treatment) HPI  Edward Lane is a 37 y.o. male complaining of intermittent sub-xiphoid pain x10-14 days weeks worseneing in severity and frequency over the last 3 days associated with nausea and NBNB emesis, and single episode of loose stool this AM. Pt states pain is alleviated by eating. Denies fever, sick contacts, hematemesis, melena, hematochezia, excessive NSIAD/EtOH use.   History reviewed. No pertinent past medical history. History reviewed. No pertinent past surgical history. No family history on file. History  Substance Use Topics  . Smoking status: Never Smoker   . Smokeless tobacco: Never Used  . Alcohol Use: No    Review of Systems 10 systems reviewed and found to be negative, except as noted in the HPI  Allergies  Apple  Home Medications   Current Outpatient Rx  Name  Route  Sig  Dispense  Refill  . ibuprofen (ADVIL,MOTRIN) 800 MG tablet   Oral   Take 1 tablet (800 mg total) by mouth 3 (three) times daily.   30 tablet   0    BP 109/77  Pulse 82  Temp(Src) 97.6 F (36.4 C) (Oral)  Resp 20  Ht 6' (1.829 m)  Wt 245 lb 2 oz (111.188 kg)  BMI 33.24 kg/m2  SpO2 95% Physical Exam  Nursing note and vitals reviewed. Constitutional: He is oriented to person, place, and time. He appears well-developed and well-nourished. No distress.  HENT:  Head: Normocephalic.  Mouth/Throat: Oropharynx is clear and moist.  Eyes: Conjunctivae and EOM are normal. Pupils are equal, round, and reactive to light.  Neck: Normal range of motion.  Cardiovascular: Normal rate, regular rhythm and intact distal pulses.   Pulmonary/Chest: Effort normal and breath sounds normal. No stridor. No respiratory distress. He has  no wheezes. He has no rales. He exhibits no tenderness.  Abdominal: Soft. Bowel sounds are normal. He exhibits no distension and no mass. There is tenderness. There is no rebound and no guarding.  Exquisitely TTP in the epigastrium with no guarding or rebound.       Musculoskeletal: Normal range of motion.  Neurological: He is alert and oriented to person, place, and time.  Psychiatric: He has a normal mood and affect.    ED Course  Procedures (including critical care time) Labs Review Labs Reviewed  CBC WITH DIFFERENTIAL - Abnormal; Notable for the following:    MCHC 36.5 (*)    All other components within normal limits  BASIC METABOLIC PANEL - Abnormal; Notable for the following:    Glucose, Bld 121 (*)    All other components within normal limits  HEPATIC FUNCTION PANEL  LIPASE, BLOOD  TROPONIN I  OCCULT BLOOD, POC DEVICE   Imaging Review US Abdomen Complete  08/08/2013   CLINICAL DATA:  Abdominal pain  EXAM: ULTRASOUND ABDOMEN COMPLETE  COMPARISON:  None.  FINDINGS: Gallbladder  Stone identified within the gallbladder lumen measures 9 mm. There is also a polyp within the gallbladder measuring 6 mm. No gallbladder wall thickening or pericholecystic fluid. Negative sonographic Murphy's sign.  Common bile duct  Diameter: Mildly increased in caliber measuring 7.4 mm.  Liver  No focal lesion identified. Within normal limits in parenchymal echogenicity.  IVC  No abnormality visualized.  Pancreas  Visualized portion unremarkable.  Spleen  Size and appearance within normal limits.  Right Kidney  Length: Measures 11.5 cm. Echogenicity within normal limits. No mass or hydronephrosis visualized.  Left Kidney  Length: Measures 12.3 cm. Echogenicity within normal limits. No mass or hydronephrosis visualized.  Abdominal aorta  No aneurysm visualized.  IMPRESSION: 1. Gallstone.  2. Mild increased caliber of the common bile duct.   Electronically Signed   By: Signa Kell M.D.   On: 08/08/2013 11:59    Ct Abdomen Pelvis W Contrast  08/08/2013   CLINICAL DATA:  Epigastric pain.  EXAM: CT ABDOMEN AND PELVIS WITH CONTRAST  TECHNIQUE: Multidetector CT imaging of the abdomen and pelvis was performed using the standard protocol following bolus administration of intravenous contrast.  CONTRAST:  OMNIPAQUE IOHEXOL 300 MG/ML  SOLN  COMPARISON:  Abdominal ultrasound earlier today.  FINDINGS: Lung bases are within normal.  Abdominal images demonstrate a normal liver, spleen, pancreas, gallbladder and adrenal glands. Kidneys are within normal. Ureters are within normal. The appendix is normal. There is no free fluid or focal inflammatory change present.  Pelvic images are unremarkable. Bones and soft tissues are within normal.  IMPRESSION: No acute findings in the abdomen/pelvis.   Electronically Signed   By: Elberta Fortis M.D.   On: 08/08/2013 13:36    Date: 08/08/2013  Rate: 87  Rhythm: normal sinus rhythm  QRS Axis: normal  Intervals: normal  ST/T Wave abnormalities: normal  Conduction Disutrbances:none  Narrative Interpretation:   Old EKG Reviewed: unchanged   MDM   1. Epigastric abdominal pain   2. Emesis     Filed Vitals:   08/08/13 1200 08/08/13 1245 08/08/13 1345 08/08/13 1445  BP: 114/65 115/76 124/83 117/75  Pulse: 60 59 64 58  Temp:      TempSrc:      Resp:      Height:      Weight:      SpO2: 98% 97% 98% 99%     Edward Lane is a 37 y.o. male with severe epigastric pain and emesis. Serial ABD exams are non-surgical, however emesis is refractory to treatment. Blood work EKG, guaiac show no severe abnormalities. Abdominal ultrasound shows a single gallstone with no inflammation and a very mild dilatation of the common bile duct. CT abdomen pelvis also shows no acute abnormalities.   Medications  gi cocktail (Maalox,Lidocaine,Donnatal) (not administered)  promethazine (PHENERGAN) injection 12.5 mg (not administered)  morphine 4 MG/ML injection 4 mg (4 mg Intravenous  Given 08/08/13 1038)  ondansetron (ZOFRAN) injection 4 mg (4 mg Intravenous Given 08/08/13 1037)  sodium chloride 0.9 % bolus 1,000 mL (0 mLs Intravenous Stopped 08/08/13 1348)  famotidine (PEPCID) IVPB 20 mg (0 mg Intravenous Stopped 08/08/13 1154)  HYDROmorphone (DILAUDID) injection 2 mg (2 mg Intravenous Given 08/08/13 1207)  iohexol (OMNIPAQUE) 300 MG/ML solution 25 mL (25 mLs Oral Contrast Given 08/08/13 1251)  iohexol (OMNIPAQUE) 300 MG/ML solution 100 mL (100 mLs Intravenous Contrast Given 08/08/13 1317)  ondansetron (ZOFRAN) injection 4 mg (4 mg Intravenous Given 08/08/13 1346)    Note: Portions of this report may have been transcribed using voice recognition software. Every effort was made to ensure accuracy; however, inadvertent computerized transcription errors may be present      Wynetta Emery, PA-C 08/08/13 1530

## 2013-08-08 NOTE — Progress Notes (Signed)
Called nurse back-RN asked to call me back- she is doing an IV. Awaiting call.

## 2013-08-08 NOTE — ED Notes (Signed)
Pt c/o abd pain/epigastric onset 2 weeks that's gradually getting worse Sxs include: nauseas, diarrhea Denies: fevers, vomiting, weakness Alert w/no signs of acute distress.

## 2013-08-08 NOTE — ED Notes (Signed)
Pt. Transferred here from UC with abdominal pain for 2 weeks. For further evaluation.nausea, no vomiting.

## 2013-08-08 NOTE — H&P (Signed)
[facility] Admission History and Physical Service Pager: [phone]  Patient name: Edward Lane Medical record number: [id number] Date of birth: April 17, 1976 Age: 37 y.o. Gender: male  Primary Care Provider: Barbaraann Barthel, MD Consultants: Surgery Code Status: full  Chief Complaint: Epigastric pain  Assessment and Plan: Edward Lane is a 38 y.o. male presenting with worsening epigastric pain over the past 2 weeks now with associated nausea and vomiting . No significant past medical history.  # Epigastric pain, nausea, vomiting: cholelithiasis w/ ductal dilation on u/s, CT negative, labs negative (lipase, LFTs, CBC, chem), symptoms more consistent with duodenal ulcer but FOBT negative and no anemia or increased BUN - HIDA scan to eval for Bilary dysfunction, if positive consult surgery for possible cholecystectomy - Pain management with dilaudid (morphine less effective and pain improved with dilaudid in ED) - Zofran for nausea - Okay to eat dinner if able to tolerate then NPO after midnight for HIDA in Morning - IV protonix  FEN/GI: NPO aftermidnight MIVF @ 150 Prophylaxis: SQ heparin, PPI as above  Disposition: Admit to med surg pending tolerating po and po pain control  History of Present Illness: Edward Lane is a 37 y.o. male presenting with 2 weeks of worsening epigastric pain. He reports that initially the pain was mild and completely relived by eating and he thought it was just indigestion. It continued to worsen and he developed associated nausea so he made an appt with PCP that he missed. This morning it was so severe that he could not move. He says it have been waxing and waning, mostly associated with eating. He didn't take anything for it and nothing seemed to make it better or worse besides eating. He denies bowel changes or vomiting before today.   In the ED he got zofran x2, 1L NS, morphine 4mg , dilaudid 2mg , and phenergan. U/s showed  gallstone and dilated CBD, CT negative.  Review Of Systems: Per HPI with the following additions: No fever, chills Otherwise 12 point review of systems was performed and was unremarkable.  Patient Active Problem List   Diagnosis Date Noted  . Lane back pain 04/10/2013  . Headache(784.0) 03/27/2013  . Neck pain on left side 03/04/2013  . Allergic rhinitis due to pollen 01/25/2011  . Environmental allergies 01/25/2011  . Obesity, unspecified 06/23/2010   Past Medical History: History reviewed. No pertinent past medical history. Past Surgical History: History reviewed. No pertinent past surgical history. Social History: History  Substance Use Topics  . Smoking status: Never Smoker   . Smokeless tobacco: Never Used  . Alcohol Use: No   Please also refer to relevant sections of EMR.  Family History: No family history on file. Allergies and Medications: Allergies  Allergen Reactions  . Apple     Unknown  . Other     Unknown reaction--tree nuts and another food (can't remember)   No current facility-administered medications on file prior to encounter.   No current outpatient prescriptions on file prior to encounter.    Objective: BP 117/75  Pulse 58  Temp(Src) 97.6 F (36.4 C) (Oral)  Resp 20  Ht 6' (1.829 m)  Wt 245 lb 2 oz (111.188 kg)  BMI 33.24 kg/m2  SpO2 99% Exam: General: Lying in bed in mild distress, somewhat sedated but answers questions appropriately HEENT: MMM, oropharynx clear, EOMI Cardiovascular: RRR, no MRG Respiratory: CTAB, normal WOB Abdomen: soft, nondistended, mildly tender over epigastrum (patient reports it was more diffuse and severe before pain meds), negative  murphy's Extremities: no LE edema Skin: warm, dry, no rash Neuro: drowsy but oriented  Labs and Imaging:  08/08/2013 10:56  Sodium 139  Potassium 3.8  Chloride 102  CO2 26  BUN 13  Creatinine 0.94  Calcium 9.4  Glucose 121 (H)  Alkaline Phosphatase 104  Albumin 4.0   Lipase 46  AST 21  ALT 17  Total Protein 7.9  Bilirubin, Direct <0.1  Total Bilirubin 0.4  Troponin I <0.30  WBC 9.4  RBC 4.90  Hemoglobin 14.3  HCT 39.2  Platelets 298  FOBT negative Ultrasound Abdomen: Gallstone. Mild increased caliber of common bile duct CT Abdomen: No acute findings in the abdomen/pelvis.  Edward Low, MD 08/08/2013, 3:32 PM PGY-1, [facility] FPTS Intern pager: [phone], text pages welcome   R2 Addendum:  I have seen the above patient and have discussed the patient's presentation, history, objective data, physical exam and assessment and plan with Dr. Richarda Blade.  Briefly, this patient is a 37 y.o. year old male who presents with 2 week history of worsening epigastric/RUQ pain that is worse first thing in the mornings and is relieved by eating.  Exam as above without significant abdominal findings - markedly normal Murphy's. Story and exam consistent with duodenal ulcer so will treat with PPI however given imaging findings HIDA warranted for further evaluation of gallbladder dysfunction.  Plan symptomatic control; once symptoms improve plan d/c.  If HIDA + consult surgery for consideration of IP vs interval cholecystectomy.    Edward Mews, DO [facility] Resident - PGY-3 08/08/2013 7:37 PM

## 2013-08-09 ENCOUNTER — Inpatient Hospital Stay (HOSPITAL_COMMUNITY): Payer: BC Managed Care – PPO

## 2013-08-09 MED ORDER — OMEPRAZOLE MAGNESIUM 20 MG PO TBEC: 20.0000 mg | DELAYED_RELEASE_TABLET | Freq: Every day | ORAL | Status: DC

## 2013-08-09 MED ORDER — ONDANSETRON HCL 4 MG PO TABS: 4.0000 mg | ORAL_TABLET | Freq: Four times a day (QID) | ORAL | Status: DC | PRN

## 2013-08-09 MED ORDER — TECHNETIUM TC 99M MEBROFENIN IV KIT
5.0000 | PACK | Freq: Once | INTRAVENOUS | Status: AC | PRN
  Administered 2013-08-09: 5 via INTRAVENOUS

## 2013-08-09 NOTE — Discharge Summary (Signed)
FMTS Attending Admission Note: Edward Lahmann,MD I  have seen and examined this patient, reviewed their chart. I have discussed this patient with the resident. I agree with the resident's findings, assessment and care plan.  

## 2013-08-09 NOTE — Progress Notes (Signed)
Family Medicine Teaching Service Daily Progress Note Intern Pager: [phone]  Patient name: Edward Lane Medical record number: [id number] Date of birth: April 24, 1976 Age: 37 y.o. Gender: male  Primary Care Provider: Barbaraann Barthel, MD Consultants: None Code Status: Full  Assessment and Plan: Asbury Hair is a 37 y.o. male presenting with worsening epigastric pain over the past 2 weeks now with associated nausea and vomiting found to have a solitary gallstone on u/s, unremarkable CT, negative HIDA scan with symptoms suggestive of PUD with duodenal ulcer . No significant past medical history.   # Epigastric pain, nausea, vomiting: cholelithiasis w/ ductal dilation on u/s, CT negative, labs negative (lipase, LFTs, CBC, chem), symptoms more consistent with duodenal ulcer but FOBT negative and no anemia or increased BUN  - HIDA scan negative - Pain management-no meds since noon yesterday. Pain has improved - Zofran for nausea  - tolerated dinner last night without pain  - IV protonix -->transition to omeprazole 20mg  for d/c home  FEN/GI: regular diet Prophylaxis: SQ heparin, PPI as above   Disposition: discharge today   Subjective: No complaints this morning. Pain has resolved.   Objective: Temp:  [97.6 F (36.4 C)-97.9 F (36.6 C)] 97.9 F (36.6 C) (10/05 0521) Pulse Rate:  [50-68] 68 (10/05 0521) Resp:  [18] 18 (10/05 0521) BP: (94-139)/(60-83) 94/60 mmHg (10/05 0521) SpO2:  [97 %-100 %] 100 % (10/05 0521) Weight:  [242 lb 8.1 oz (110 kg)] 242 lb 8.1 oz (110 kg) (10/04 1722) Physical Exam: General: well appearing, NAD Cardiovascular: RRR, no mrg Respiratory: CTAB Abdomen: soft/nontender/nondistended. Normal bowel sounds.  Extremities: no edema, moves all extremities  Laboratory:  Recent Labs Lab 08/08/13 1056  WBC 9.4  HGB 14.3  HCT 39.2  PLT 298    Recent Labs Lab 08/08/13 1056  NA 139  K 3.8  CL 102  CO2 26  BUN 13  CREATININE 0.94  CALCIUM 9.4  PROT  7.9  BILITOT 0.4  ALKPHOS 104  ALT 17  AST 21  GLUCOSE 121*   Imaging/Diagnostic Tests: Nm Hepatobiliary Liver Func  08/09/2013   CLINICAL DATA:  Epigastric pain, cholelithiasis.  EXAM: NUCLEAR MEDICINE HEPATOHBILIARY INCLUDE GB  TECHNIQUE: Sequential planar images over the upper abdomen were obtained in the anterior and right lateral projection over 1 hr.  COMPARISON:  Abdominal ultrasound and CT 08/08/2013  RADIOPHARMACEUTICALS:  5 mCi of technetium 99 M labeled Choletec IV.  FINDINGS: Examination demonstrates normal uniform uptake of radiotracer by the liver. There is normal biliary to bowel transit present at 15 min and gallbladder filling noted at 40-45 min.  IMPRESSION: Normal hepatobiliary scan without evidence of common bile duct obstruction or acute cholecystitis.   Electronically Signed   By: Elberta Fortis M.D.   On: 08/09/2013 09:14   US Abdomen Complete  08/08/2013   CLINICAL DATA:  Abdominal pain  EXAM: ULTRASOUND ABDOMEN COMPLETE  COMPARISON:  None.  FINDINGS: Gallbladder  Stone identified within the gallbladder lumen measures 9 mm. There is also a polyp within the gallbladder measuring 6 mm. No gallbladder wall thickening or pericholecystic fluid. Negative sonographic Murphy's sign.  Common bile duct  Diameter: Mildly increased in caliber measuring 7.4 mm.  Liver  No focal lesion identified. Within normal limits in parenchymal echogenicity.  IVC  No abnormality visualized.  Pancreas  Visualized portion unremarkable.  Spleen  Size and appearance within normal limits.  Right Kidney  Length: Measures 11.5 cm. Echogenicity within normal limits. No mass or hydronephrosis visualized.  Left Kidney  Length: Measures 12.3 cm. Echogenicity within normal limits. No mass or hydronephrosis visualized.  Abdominal aorta  No aneurysm visualized.  IMPRESSION: 1. Gallstone.  2. Mild increased caliber of the common bile duct.   Electronically Signed   By: Signa Kell M.D.   On: 08/08/2013 11:59   Ct  Abdomen Pelvis W Contrast  08/08/2013   CLINICAL DATA:  Epigastric pain.  EXAM: CT ABDOMEN AND PELVIS WITH CONTRAST  TECHNIQUE: Multidetector CT imaging of the abdomen and pelvis was performed using the standard protocol following bolus administration of intravenous contrast.  CONTRAST:  OMNIPAQUE IOHEXOL 300 MG/ML  SOLN  COMPARISON:  Abdominal ultrasound earlier today.  FINDINGS: Lung bases are within normal.  Abdominal images demonstrate a normal liver, spleen, pancreas, gallbladder and adrenal glands. Kidneys are within normal. Ureters are within normal. The appendix is normal. There is no free fluid or focal inflammatory change present.  Pelvic images are unremarkable. Bones and soft tissues are within normal.  IMPRESSION: No acute findings in the abdomen/pelvis.   Electronically Signed   By: Elberta Fortis M.D.   On: 08/08/2013 13:36     Shelva Majestic, MD 08/09/2013, 10:46 AM PGY-3, [facility] FPTS Intern pager: [phone], text pages welcome

## 2013-08-09 NOTE — Discharge Summary (Signed)
Family Medicine Teaching [facility] Discharge Summary  Patient name: Edward Lane Medical record number: [id number] Date of birth: 05/21/1976 Age: 37 y.o. Gender: male Date of Admission: 08/08/2013  Date of Discharge: 08/09/13  Admitting Physician: Janit Pagan, MD  Primary Care Provider: Barbaraann Barthel, MD Consultants: None  Indication for Hospitalization: abdominal pain with nausea/vomiting  Brief Hospital Course/Discharge Diagnoses/Problem List:  Jvon Meroney is a 37 y.o. male presenting with unremarkable past medical history  Who presented with worsening epigastric pain over 2 weeks who developed nausea and vomiting. Pain was typically worse in the morning and improved after he ate breakfast and in fact, eating always helped relieve the pain. Leading the differential was a duodenal ulcer.  Initial workup included unremarkable CMET, CBC, lipase, troponin, CT scan. Was found to have a solitary gallstone 9mm on abdominal ultrasound. HIDA scan was completed which was normal without common bile duct obstruction or acute cholecystitis.  During hospitalization after being placed on PPI and having 1 dose of dilaudid, he had no further pain. Patient on IV PPI in house and discharged on omeprazole.   On day of discharge, ordered H. Pylori stool antigen but patient did not need to have a bowel movement and we were unable to collect.   Disposition: home  Discharge Condition: stable  Discharge Exam: well appearing, soft/nontender abdomen with normal bowel sounds  Issues for Follow Up:  1. Follow up abdominal pain/nausea/vomiting which had resolved at discharge. Presumed due to duodenal ulcer and was started on omperazole. Please follow up avoidance of nsaids, alcohol and smoking (nonsmoker nondrinker though) 2. Please check stool antigen for H. Pylori as this was not collected before discharge and could change management if positive.   Significant Labs and Imaging:   Recent Labs Lab  08/08/13 1056  WBC 9.4  HGB 14.3  HCT 39.2  PLT 298    Recent Labs Lab 08/08/13 1056  NA 139  K 3.8  CL 102  CO2 26  GLUCOSE 121*  BUN 13  CREATININE 0.94  CALCIUM 9.4  ALKPHOS 104  AST 21  ALT 17  ALBUMIN 4.0    Nm Hepatobiliary Liver Func  08/09/2013   CLINICAL DATA:  Epigastric pain, cholelithiasis.  EXAM: NUCLEAR MEDICINE HEPATOHBILIARY INCLUDE GB  TECHNIQUE: Sequential planar images over the upper abdomen were obtained in the anterior and right lateral projection over 1 hr.  COMPARISON:  Abdominal ultrasound and CT 08/08/2013  RADIOPHARMACEUTICALS:  5 mCi of technetium 99 M labeled Choletec IV.  FINDINGS: Examination demonstrates normal uniform uptake of radiotracer by the liver. There is normal biliary to bowel transit present at 15 min and gallbladder filling noted at 40-45 min.  IMPRESSION: Normal hepatobiliary scan without evidence of common bile duct obstruction or acute cholecystitis.   Electronically Signed   By: Elberta Fortis M.D.   On: 08/09/2013 09:14   US Abdomen Complete  08/08/2013   CLINICAL DATA:  Abdominal pain  EXAM: ULTRASOUND ABDOMEN COMPLETE  COMPARISON:  None.  FINDINGS: Gallbladder  Stone identified within the gallbladder lumen measures 9 mm. There is also a polyp within the gallbladder measuring 6 mm. No gallbladder wall thickening or pericholecystic fluid. Negative sonographic Murphy's sign.  Common bile duct  Diameter: Mildly increased in caliber measuring 7.4 mm.  Liver  No focal lesion identified. Within normal limits in parenchymal echogenicity.  IVC  No abnormality visualized.  Pancreas  Visualized portion unremarkable.  Spleen  Size and appearance within normal limits.  Right Kidney  Length: Measures 11.5  cm. Echogenicity within normal limits. No mass or hydronephrosis visualized.  Left Kidney  Length: Measures 12.3 cm. Echogenicity within normal limits. No mass or hydronephrosis visualized.  Abdominal aorta  No aneurysm visualized.  IMPRESSION: 1.  Gallstone.  2. Mild increased caliber of the common bile duct.   Electronically Signed   By: Signa Kell M.D.   On: 08/08/2013 11:59   Ct Abdomen Pelvis W Contrast  08/08/2013   CLINICAL DATA:  Epigastric pain.  EXAM: CT ABDOMEN AND PELVIS WITH CONTRAST  TECHNIQUE: Multidetector CT imaging of the abdomen and pelvis was performed using the standard protocol following bolus administration of intravenous contrast.  CONTRAST:  OMNIPAQUE IOHEXOL 300 MG/ML  SOLN  COMPARISON:  Abdominal ultrasound earlier today.  FINDINGS: Lung bases are within normal.  Abdominal images demonstrate a normal liver, spleen, pancreas, gallbladder and adrenal glands. Kidneys are within normal. Ureters are within normal. The appendix is normal. There is no free fluid or focal inflammatory change present.  Pelvic images are unremarkable. Bones and soft tissues are within normal.  IMPRESSION: No acute findings in the abdomen/pelvis.   Electronically Signed   By: Elberta Fortis M.D.   On: 08/08/2013 13:36   Discharge Medications:    Medication List         omeprazole 20 MG tablet  Commonly known as:  PRILOSEC OTC  Take 1 tablet (20 mg total) by mouth daily.     ondansetron 4 MG tablet  Commonly known as:  ZOFRAN  Take 1 tablet (4 mg total) by mouth every 6 (six) hours as needed for nausea.        Discharge Instructions: Please refer to Patient Instructions section of EMR for full details.  Patient was counseled important signs and symptoms that should prompt return to medical care, changes in medications, dietary instructions, activity restrictions, and follow up appointments.   Follow-Up Appointments: Follow-up Information   Follow up with Barbaraann Barthel, MD. Schedule an appointment as soon as possible for a visit in 1 week.   Specialty:  Family Medicine   Contact information:   [address] [place] [address] [phone]     Patient informed me at time of discharge has appointment on Friday  of this week.   Shelva Majestic, MD 08/09/2013, 11:23 AM PGY-3 , [facility]

## 2013-08-09 NOTE — Progress Notes (Signed)
FMTS Attending Admission Note: Edward Antonelli,MD I  have seen and examined this patient, reviewed their chart. I have discussed this patient with the resident. I agree with the resident's findings, assessment and care plan.  

## 2013-08-09 NOTE — H&P (Addendum)
FMTS Attending Admission Note: Rikita Grabert,MD I  have seen and examined this patient, reviewed their chart. I have discussed this patient with the resident. I agree with the resident's findings, assessment and care plan.  Briefly 37 y/o with abdominal pain/epigastric  For the last few weeks which worsened gradually,associated with nausea,no change in bowel habit,patient feeling better now,got HIDA scan done which was normal. Likely having GERD/pudPlan to advance diet and d/c if well tolerated on PPI.

## 2013-08-14 ENCOUNTER — Ambulatory Visit (INDEPENDENT_AMBULATORY_CARE_PROVIDER_SITE_OTHER): Payer: BC Managed Care – PPO | Admitting: Family Medicine

## 2013-08-14 ENCOUNTER — Encounter: Payer: Self-pay | Admitting: Family Medicine

## 2013-08-14 VITALS — BP 123/84 | HR 60 | Ht 72.0 in | Wt 250.2 lb

## 2013-08-14 DIAGNOSIS — K269 Duodenal ulcer, unspecified as acute or chronic, without hemorrhage or perforation: Secondary | ICD-10-CM | POA: Insufficient documentation

## 2013-08-14 MED ORDER — CLARITHROMYCIN 500 MG PO TABS: 500.0000 mg | ORAL_TABLET | Freq: Two times a day (BID) | ORAL | Status: DC

## 2013-08-14 MED ORDER — AMOXICILLIN 500 MG PO TABS: 1000.0000 mg | ORAL_TABLET | Freq: Two times a day (BID) | ORAL | Status: DC

## 2013-08-14 MED ORDER — OMEPRAZOLE 20 MG PO CPDR: 20.0000 mg | DELAYED_RELEASE_CAPSULE | Freq: Two times a day (BID) | ORAL | Status: DC
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
## 2013-08-14 NOTE — Assessment & Plan Note (Signed)
Suspicion that symptoms may be due to duodenal ulcer (improvement with food; absence of findings to suggest CCY).  H pylori Ab done in office (he has never had this checked before); positive Ab--> to begin triple-therapy.  Results known to me after visit; I called patient's cell and was unable to get him on the phone, so I called wife and relayed message.  Rx sent.  He is to come for H pylori urea test in 4 weeks after completing the 2-week treatment.  To call if pain persists despite this treatment. Will need to be off PPI for at least 4 weeks before urea breath test.

## 2013-08-14 NOTE — Patient Instructions (Signed)
It was a pleasure to see you today.  I agree with the suspicion that your abdominal pain may be related to a duodenal ulcer.  I recommend you to fill the omeprazole (Prilosec) and start taking daily.   I am checking a blood test for a bacteria called helicobacter pylori, which is implicated in ulcer formation.  I will call you at [phone] (cell) or wife's number [phone].  It is okay that I give her information about this test.  If the bacteria test comes back positive, I will call with instructions for "triple therapy" to eradicate the bacteria, and then test in our office approximately 1 month later.

## 2013-09-15 ENCOUNTER — Ambulatory Visit (INDEPENDENT_AMBULATORY_CARE_PROVIDER_SITE_OTHER): Payer: Self-pay | Admitting: Family Medicine

## 2013-09-15 ENCOUNTER — Encounter: Payer: Self-pay | Admitting: Family Medicine

## 2013-09-15 VITALS — BP 118/78 | HR 74 | Ht 72.0 in | Wt 253.0 lb

## 2013-09-15 DIAGNOSIS — R1013 Epigastric pain: Secondary | ICD-10-CM

## 2013-09-15 MED ORDER — RANITIDINE HCL 150 MG PO TABS: 150.0000 mg | ORAL_TABLET | Freq: Two times a day (BID) | ORAL | Status: DC

## 2013-09-15 NOTE — Patient Instructions (Signed)
It was a pleasure to see you today.  I am glad you are improving!  Please come back after December 16th for the breath test for H pylori to be sure the bacteria is eradicated.   No Prilosec or Protonix or Pepto-Bismol (bismuth) until after the breath test.   If you have symptoms, you may fill a prescription for Zantac 150mg  twice daily ([facility]).

## 2013-09-16 NOTE — Progress Notes (Signed)
  Subjective:    Patient ID: Edward Lane, male    DOB: Feb 10, 1976, 37 y.o.   MRN: [id number]  HPI Patient here for follow up of H pylori treatment.  He was diagnosed by Ab testing on Oct 11th, was started on triple-therapy.  He admits to discontinuing the therapy after about 8 or 9 days, because he had felt so much better that he forgot to keep taking.  He rates his improvement in GERD symptoms as "90%" back to normal.  No nausea/vomiting, no diarrhea or constipation.  Tolerates the regimen well.  Has resumed the meds and estimates he has about 5 days left to complete.   Neck/headache pain has remitted.  He is doing well from this standpoint.   Review of Systems     Objective:   Physical Exam Well appearing, no apparent distress HEENT Neck supple.  ABD Soft, nontender, nondistended. Audible bowel sounds noted. No masses or megaly.        Assessment & Plan:

## 2013-09-16 NOTE — Assessment & Plan Note (Signed)
Nearly completely resolved despite interrupted therapy.  I encouraged Edward Lane to complete the remaining course of the triple-therapy, and gave him instructions for the urea breath testing in 4 weeks.  No PPI or bismuth in the meantime>  I sent an H2 Blocker prescription which he may use if his symptoms return in the meantime.

## 2014-01-12 ENCOUNTER — Ambulatory Visit: Payer: Self-pay | Admitting: Family Medicine

## 2014-01-19 ENCOUNTER — Ambulatory Visit (INDEPENDENT_AMBULATORY_CARE_PROVIDER_SITE_OTHER): Payer: BC Managed Care – PPO | Admitting: Family Medicine

## 2014-01-19 ENCOUNTER — Encounter: Payer: Self-pay | Admitting: Family Medicine

## 2014-01-19 VITALS — BP 112/76 | HR 70 | Ht 72.0 in | Wt 260.0 lb

## 2014-01-19 DIAGNOSIS — M542 Cervicalgia: Secondary | ICD-10-CM

## 2014-01-19 DIAGNOSIS — K269 Duodenal ulcer, unspecified as acute or chronic, without hemorrhage or perforation: Secondary | ICD-10-CM

## 2014-01-19 MED ORDER — AMOXICILLIN 500 MG PO TABS: 1000.0000 mg | ORAL_TABLET | Freq: Two times a day (BID) | ORAL | Status: AC

## 2014-01-19 MED ORDER — OMEPRAZOLE 20 MG PO CPDR: 20.0000 mg | DELAYED_RELEASE_CAPSULE | Freq: Two times a day (BID) | ORAL | Status: DC

## 2014-01-19 MED ORDER — CLARITHROMYCIN 500 MG PO TABS: 500.0000 mg | ORAL_TABLET | Freq: Two times a day (BID) | ORAL | Status: DC

## 2014-01-19 NOTE — Patient Instructions (Signed)
It was a pleasure to see you today.  We will retreat for the H pylori bacteria, with  Amoxicillin 1000mg  twice daily Clarithromycin 500mg  twice daily Omeprazole 20mg  twice daily  All these for 14 days.   Come back in 6 weeks for breath test to be sure the bacteria is eradicated.   If you continue with pain beyond the 14-day treatment, call me back (do not wait for the breath test).
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
## 2014-01-20 NOTE — Assessment & Plan Note (Signed)
Concern for unresolved H pylori with PUD or duodenal ulcer disease.  Given that he did not complete adequate therapy in October 2014, discussed plan to either re-treat and assess, or refer directly for GI to consider EGD.  Given absence of weight loss, fevers/chills, or tobacco/alcohol hx in this 38 yr old, mutual plan to re-treat and observe.  If not complete resolution of symptoms, then to refer for GI. For urea breath test 4 weeks after triple therapy completion. JB

## 2014-03-05 ENCOUNTER — Ambulatory Visit: Payer: BC Managed Care – PPO | Admitting: Family Medicine

## 2014-03-26 ENCOUNTER — Ambulatory Visit (INDEPENDENT_AMBULATORY_CARE_PROVIDER_SITE_OTHER): Payer: BC Managed Care – PPO | Admitting: Family Medicine

## 2014-03-26 ENCOUNTER — Encounter: Payer: Self-pay | Admitting: Family Medicine

## 2014-03-26 VITALS — BP 126/85 | HR 85 | Temp 98.1°F | Ht 71.0 in | Wt 263.0 lb

## 2014-03-26 DIAGNOSIS — R21 Rash and other nonspecific skin eruption: Secondary | ICD-10-CM

## 2014-03-26 DIAGNOSIS — L259 Unspecified contact dermatitis, unspecified cause: Secondary | ICD-10-CM | POA: Insufficient documentation

## 2014-03-26 MED ORDER — PERMETHRIN 5 % EX CREA: 1.0000 "application " | TOPICAL_CREAM | Freq: Once | CUTANEOUS | Status: DC

## 2014-03-26 NOTE — Progress Notes (Signed)
   Subjective:    Patient ID: Edward Lane, male    DOB: 11/22/1975, 38 y.o.   MRN: [id number]  HPI  Rash:  Location: right elbow Duration: 3-4 days Character: small red bumps, very itchy, pt concerned that it is scabies Changes: drying out Treatments tried: none History of similar rash: itching rash with scabies between his fingers 15 years ago Exposure/Infestation Concern: working yard over the weekend  [employer]: New Drug: no Mucocutaneous Involvement: no Fever/Systemic Illness: no   Review of Systems Negative unless stated in HPI    Objective:   Physical Exam BP 126/85  Pulse 85  Temp(Src) 98.1 F (36.7 C) (Oral)  Ht 5\' 11"  (1.803 m)  Wt 263 lb (119.296 kg)  BMI 36.70 kg/m2 Gen: healthy, well appearing, non distressed OP: no mucosal involvement  Skin: right elbow area with crusted linear vesicles, no surrounding erythema, no burrowing, no rash anywhere else on body        Assessment & Plan:

## 2014-03-26 NOTE — Patient Instructions (Signed)
Mr. Edward Lane.   This is contact dermatitis, likely caused by poison ivy. It should get better in 3-4 days. Use Benadryl or hydrocortisone cream for itching. Keep area covered to the right ear children.  If this dosage change and looks more like scabies, and he can use a medication that I prescribed. However I do not recommend that at this point.  Please come back if the rash spreads or becomes more itchy.  Sincerely,   Dr. Clinton Sawyer

## 2014-03-26 NOTE — Assessment & Plan Note (Signed)
A: presumed due to allergen from the yard like poison ivy, but pt concerned about scabies P: counseled on likely course, given permethrin in case it takes more of a scabies-like infectious characteristic

## 2014-03-30 ENCOUNTER — Telehealth: Payer: Self-pay | Admitting: Family Medicine

## 2014-03-30 NOTE — Telephone Encounter (Signed)
Itching has gotten worse. Please call in prescription  Let pt know when it has been done

## 2014-03-30 NOTE — Telephone Encounter (Signed)
Called and spoke with patient, he said the rash is spreading and getting worse. According to Dr Yetta Numbers note who saw the patient for the rash he will need to come back in if it does not improve or becomes worse. I put Mr Edward Lane on the cross cover schedule for tomorrow morning.Doris Cheadle Brycin Kille

## 2014-03-31 ENCOUNTER — Ambulatory Visit (INDEPENDENT_AMBULATORY_CARE_PROVIDER_SITE_OTHER): Payer: BC Managed Care – PPO | Admitting: Family Medicine

## 2014-03-31 ENCOUNTER — Encounter: Payer: Self-pay | Admitting: Family Medicine

## 2014-03-31 VITALS — BP 125/79 | HR 77 | Temp 98.5°F | Wt 265.0 lb

## 2014-03-31 DIAGNOSIS — L259 Unspecified contact dermatitis, unspecified cause: Secondary | ICD-10-CM

## 2014-03-31 MED ORDER — TRIAMCINOLONE ACETONIDE 0.1 % EX OINT: 1.0000 "application " | TOPICAL_OINTMENT | Freq: Two times a day (BID) | CUTANEOUS | Status: DC

## 2014-03-31 MED ORDER — HYDROXYZINE PAMOATE 25 MG PO CAPS: 25.0000 mg | ORAL_CAPSULE | Freq: Three times a day (TID) | ORAL | Status: DC | PRN

## 2014-03-31 NOTE — Assessment & Plan Note (Signed)
A: Contact dermatitis, likely due to poison ivy based on linear presentation. Progressively worsening, now slightly better.  P: - Triamcinolone ointment to the areas - Vistaril prn itching/sleep - OTC Benadryl spray for comfort - F/u prn

## 2014-03-31 NOTE — Patient Instructions (Signed)
Use the triamcinolone ointment 2-3 times daily. This is the steroid cream. Take the Hydroxyzine as needed for itching, but it will make you sleepy. Use OTC generic benadryl spray for comfort.  Follow up as needed.  Take care, Cavon Nicolls M. Naydene Kamrowski, M.D.

## 2014-03-31 NOTE — Progress Notes (Signed)
Patient ID: Edward Lane, male   DOB: 07/20/1976, 38 y.o.   MRN: [id number]    Subjective: HPI: Patient is a 38 y.o. male presenting to clinic today for follow up rash. Concerns today include rash is worsening  Rash- Started on arm one week ago. He states he was working in the yard 3 days prior and was evaluated here. Diagnosed with contact dermatitis. He states it is itching and now spread from his arm to other arm and legs. He has not tried anything for the rash. (No benadryl or hydrocortisone cream.)  History Reviewed: Non smoker. Health Maintenance: UTD  ROS: Please see HPI above.  Objective: Office vital signs reviewed. BP 125/79  Pulse 77  Temp(Src) 98.5 F (36.9 C) (Oral)  Wt 265 lb (120.203 kg)  Physical Examination:  General: Awake, alert. NAD HEENT: Atraumatic, normocephalic Extremities: No edema Skin: 2cm raised, erythematous vesicular lesion on right elbow with a linear line of vesicles through it. 3-4 other small raised lesions on forearms without excoriations or drainage.  Neuro: Grossly intact  Assessment: 38 y.o. male contact dermatitis  Plan: See Problem List and After Visit Summary

## 2014-05-17 ENCOUNTER — Emergency Department (HOSPITAL_COMMUNITY): Payer: BC Managed Care – PPO

## 2014-05-17 ENCOUNTER — Emergency Department (INDEPENDENT_AMBULATORY_CARE_PROVIDER_SITE_OTHER)
Admission: EM | Admit: 2014-05-17 | Discharge: 2014-05-17 | Disposition: A | Discharge status: Other Acute Inpatient Hospital | Payer: BC Managed Care – PPO | Source: Home / Self Care | Attending: Family Medicine | Admitting: Family Medicine

## 2014-05-17 ENCOUNTER — Observation Stay (HOSPITAL_COMMUNITY)
Admission: EM | Admit: 2014-05-17 | Discharge: 2014-05-19 | Disposition: A | Discharge status: Home / Self Care | Payer: BC Managed Care – PPO | Attending: Surgery | Admitting: Surgery

## 2014-05-17 ENCOUNTER — Encounter (HOSPITAL_COMMUNITY): Payer: Self-pay | Admitting: Emergency Medicine

## 2014-05-17 DIAGNOSIS — R1011 Right upper quadrant pain: Secondary | ICD-10-CM

## 2014-05-17 DIAGNOSIS — K8020 Calculus of gallbladder without cholecystitis without obstruction: Secondary | ICD-10-CM

## 2014-05-17 DIAGNOSIS — K8010 Calculus of gallbladder with chronic cholecystitis without obstruction: Secondary | ICD-10-CM | POA: Insufficient documentation

## 2014-05-17 DIAGNOSIS — R52 Pain, unspecified: Secondary | ICD-10-CM

## 2014-05-17 DIAGNOSIS — K8000 Calculus of gallbladder with acute cholecystitis without obstruction: Secondary | ICD-10-CM | POA: Diagnosis not present

## 2014-05-17 DIAGNOSIS — R1013 Epigastric pain: Secondary | ICD-10-CM

## 2014-05-17 DIAGNOSIS — K810 Acute cholecystitis: Secondary | ICD-10-CM | POA: Diagnosis present

## 2014-05-17 LAB — HEPATIC FUNCTION PANEL
ALK PHOS: 109 U/L (ref 39–117)
ALT: 21 U/L (ref 0–53)
AST: 29 U/L (ref 0–37)
Albumin: 3.7 g/dL (ref 3.5–5.2)
Bilirubin, Direct: <0.2 mg/dL (ref 0.0–0.3)
TOTAL PROTEIN: 7.3 g/dL (ref 6.0–8.3)
Total Bilirubin: 0.6 mg/dL (ref 0.3–1.2)

## 2014-05-17 LAB — CBC WITH DIFFERENTIAL/PLATELET
BASOS ABS: 0 10*3/uL (ref 0.0–0.1)
Basophils Relative: 0 % (ref 0–1)
Eosinophils Absolute: 0.2 10*3/uL (ref 0.0–0.7)
Eosinophils Relative: 2 % (ref 0–5)
HCT: 37.8 % — ABNORMAL LOW (ref 39.0–52.0)
Hemoglobin: 12.9 g/dL — ABNORMAL LOW (ref 13.0–17.0)
LYMPHS PCT: 18 % (ref 12–46)
Lymphs Abs: 1.7 10*3/uL (ref 0.7–4.0)
MCH: 27.5 pg (ref 26.0–34.0)
MCHC: 34.1 g/dL (ref 30.0–36.0)
MCV: 80.6 fL (ref 78.0–100.0)
Monocytes Absolute: 0.6 10*3/uL (ref 0.1–1.0)
Monocytes Relative: 6 % (ref 3–12)
NEUTROS ABS: 7 10*3/uL (ref 1.7–7.7)
NEUTROS PCT: 74 % (ref 43–77)
PLATELETS: 242 10*3/uL (ref 150–400)
RBC: 4.69 MIL/uL (ref 4.22–5.81)
RDW: 12.5 % (ref 11.5–15.5)
WBC: 9.5 10*3/uL (ref 4.0–10.5)

## 2014-05-17 LAB — AMYLASE: AMYLASE: 51 U/L (ref 0–105)

## 2014-05-17 LAB — BASIC METABOLIC PANEL
Anion gap: 12 (ref 5–15)
BUN: 8 mg/dL (ref 6–23)
CO2: 27 mEq/L (ref 19–32)
Calcium: 8.9 mg/dL (ref 8.4–10.5)
Chloride: 106 mEq/L (ref 96–112)
Creatinine, Ser: 0.92 mg/dL (ref 0.50–1.35)
GFR calc non Af Amer: >90 mL/min (ref >90)
Glucose, Bld: 113 mg/dL — ABNORMAL HIGH (ref 70–99)
POTASSIUM: 3.6 meq/L — AB (ref 3.7–5.3)
SODIUM: 145 meq/L (ref 137–147)

## 2014-05-17 LAB — LIPASE, BLOOD: Lipase: 31 U/L (ref 11–59)

## 2014-05-17 MED ORDER — ONDANSETRON HCL 4 MG/2ML IJ SOLN
4.0000 mg | Freq: Once | INTRAMUSCULAR | Status: AC
  Administered 2014-05-17: 4 mg via INTRAVENOUS
  Filled 2014-05-17: qty 2

## 2014-05-17 MED ORDER — ONDANSETRON HCL 4 MG/2ML IJ SOLN
4.0000 mg | Freq: Four times a day (QID) | INTRAMUSCULAR | Status: DC | PRN
Start: 2014-05-17 — End: 2014-05-19
  Administered 2014-05-18: 4 mg via INTRAVENOUS
  Filled 2014-05-17: qty 2

## 2014-05-17 MED ORDER — ONDANSETRON HCL 4 MG/2ML IJ SOLN
4.0000 mg | Freq: Once | INTRAMUSCULAR | Status: AC
  Administered 2014-05-17: 4 mg via INTRAVENOUS

## 2014-05-17 MED ORDER — HYDROMORPHONE HCL 1 MG/ML IJ SOLN
1.0000 mg | Freq: Once | INTRAMUSCULAR | Status: AC
  Administered 2014-05-17: 1 mg via INTRAVENOUS

## 2014-05-17 MED ORDER — ENOXAPARIN SODIUM 40 MG/0.4ML ~~LOC~~ SOLN
40.0000 mg | SUBCUTANEOUS | Status: DC
  Administered 2014-05-18: 40 mg via SUBCUTANEOUS
  Filled 2014-05-17 (×2): qty 0.4

## 2014-05-17 MED ORDER — HYDROMORPHONE HCL PF 1 MG/ML IJ SOLN
1.0000 mg | INTRAMUSCULAR | Status: DC | PRN
  Administered 2014-05-17: 2 mg via INTRAVENOUS
  Administered 2014-05-18 – 2014-05-19 (×2): 1 mg via INTRAVENOUS
  Filled 2014-05-17: qty 2
  Filled 2014-05-17 (×2): qty 1

## 2014-05-17 MED ORDER — KCL IN DEXTROSE-NACL 20-5-0.45 MEQ/L-%-% IV SOLN
INTRAVENOUS | Status: DC
  Administered 2014-05-18 – 2014-05-19 (×4): via INTRAVENOUS
  Filled 2014-05-17 (×9): qty 1000

## 2014-05-17 MED ORDER — ONDANSETRON HCL 4 MG/2ML IJ SOLN
INTRAMUSCULAR | Status: AC
  Filled 2014-05-17: qty 2

## 2014-05-17 MED ORDER — HYDROMORPHONE HCL PF 1 MG/ML IJ SOLN
1.0000 mg | Freq: Once | INTRAMUSCULAR | Status: AC
  Administered 2014-05-17: 1 mg via INTRAVENOUS
  Filled 2014-05-17: qty 1

## 2014-05-17 MED ORDER — SODIUM CHLORIDE 0.9 % IV SOLN
Freq: Once | INTRAVENOUS | Status: AC
  Administered 2014-05-17: 15:00:00 via INTRAVENOUS

## 2014-05-17 MED ORDER — GI COCKTAIL ~~LOC~~
30.0000 mL | Freq: Once | ORAL | Status: AC
  Administered 2014-05-17: 30 mL via ORAL

## 2014-05-17 MED ORDER — HYDROMORPHONE HCL PF 1 MG/ML IJ SOLN
INTRAMUSCULAR | Status: AC
  Filled 2014-05-17: qty 1

## 2014-05-17 MED ORDER — PANTOPRAZOLE SODIUM 40 MG IV SOLR
40.0000 mg | Freq: Once | INTRAVENOUS | Status: AC
  Administered 2014-05-17: 40 mg via INTRAVENOUS
  Filled 2014-05-17: qty 40

## 2014-05-17 MED ORDER — GI COCKTAIL ~~LOC~~
ORAL | Status: AC
  Filled 2014-05-17: qty 30

## 2014-05-17 MED ORDER — PANTOPRAZOLE SODIUM 40 MG IV SOLR
40.0000 mg | Freq: Every day | INTRAVENOUS | Status: DC
  Administered 2014-05-18: 40 mg via INTRAVENOUS
  Filled 2014-05-17 (×2): qty 40

## 2014-05-17 MED ORDER — PIPERACILLIN-TAZOBACTAM 3.375 G IVPB
3.3750 g | Freq: Three times a day (TID) | INTRAVENOUS | Status: DC
  Administered 2014-05-18 – 2014-05-19 (×6): 3.375 g via INTRAVENOUS
  Filled 2014-05-17 (×8): qty 50

## 2014-05-17 NOTE — ED Notes (Signed)
Pt still with questions regarding surgery.  Will hold off until Dr. Lindie SpruceWyatt comes to assess pt for pre-op.

## 2014-05-17 NOTE — ED Provider Notes (Signed)
CSN: [id number]     Arrival date & time 05/17/14  1749 History   First MD Initiated Contact with Patient 05/17/14 1755     Chief Complaint  Patient presents with  . Abdominal Pain     (Consider location/radiation/quality/duration/timing/severity/associated sxs/prior Treatment) HPI Comments: Patient with history of H. pylori approximately 3 months ago treated empirically with improvement, no EGD -- presents with acute onset of epigastric and right upper quadrant abdominal pain that began at around noon. It has been associated with cramping epigastric pain without radiation. It has been associated with nausea and diarrhea. Patient was seen at urgent care and had normal CBC, ENT, lipase and amylase. Patient was given a lot it with improvement in pain. Pain is now beginning to return. No fevers. No lower, pain. No urinary symptoms. No history of abdominal surgeries. Patient denies alcohol use. He denies heavy NSAID use. He denies recent antibiotics. The onset of this condition was acute. The course is constant. Aggravating factors: none. Alleviating factors: none.    Patient is a 38 y.o. male presenting with abdominal pain. The history is provided by the patient.  Abdominal Pain Associated symptoms: diarrhea and nausea   Associated symptoms: no chest pain, no constipation, no cough, no dysuria, no fever, no sore throat and no vomiting     History reviewed. No pertinent past medical history. History reviewed. No pertinent past surgical history. History reviewed. No pertinent family history. History  Substance Use Topics  . Smoking status: Never Smoker   . Smokeless tobacco: Never Used  . Alcohol Use: No    Review of Systems  Constitutional: Negative for fever.  HENT: Negative for rhinorrhea and sore throat.   Eyes: Negative for redness.  Respiratory: Negative for cough.   Cardiovascular: Negative for chest pain.  Gastrointestinal: Positive for nausea, abdominal pain and diarrhea.  Negative for vomiting and constipation.  Genitourinary: Negative for dysuria.  Musculoskeletal: Negative for myalgias.  Skin: Negative for rash.  Neurological: Negative for headaches.   Allergies  Apple and Other  Home Medications   Prior to Admission medications   Medication Sig Start Date End Date Taking? Authorizing Provider  clarithromycin (BIAXIN) 500 MG tablet Take 1 tablet (500 mg total) by mouth 2 (two) times daily. 01/19/14   Barbaraann Barthel, MD  hydrOXYzine (VISTARIL) 25 MG capsule Take 1 capsule (25 mg total) by mouth 3 (three) times daily as needed. 03/31/14   Amber Nydia Bouton, MD  omeprazole (PRILOSEC) 20 MG capsule Take 1 capsule (20 mg total) by mouth 2 (two) times daily. 01/19/14   Barbaraann Barthel, MD  ondansetron (ZOFRAN) 4 MG tablet Take 1 tablet (4 mg total) by mouth every 6 (six) hours as needed for nausea. 08/09/13   Shelva Majestic, MD  ranitidine (ZANTAC) 150 MG tablet Take 1 tablet (150 mg total) by mouth 2 (two) times daily. 09/15/13   Barbaraann Barthel, MD  triamcinolone ointment (KENALOG) 0.1 % Apply 1 application topically 2 (two) times daily. 03/31/14   Amber Nydia Bouton, MD   BP 137/88  Pulse 66  Temp(Src) 97.6 F (36.4 C) (Oral)  Resp 18  SpO2 100%  Physical Exam  Nursing note and vitals reviewed. Constitutional: He appears well-developed and well-nourished.  HENT:  Head: Normocephalic and atraumatic.  Eyes: Conjunctivae are normal. Right eye exhibits no discharge. Left eye exhibits no discharge.  Neck: Normal range of motion. Neck supple.  Cardiovascular: Normal rate, regular rhythm and normal heart sounds.   Pulmonary/Chest: Effort normal  and breath sounds normal.  Abdominal: Soft. Bowel sounds are normal. He exhibits no distension. There is tenderness (mild-moderate) in the right upper quadrant and epigastric area. There is no rigidity, no rebound, no guarding, no CVA tenderness, no tenderness at McBurney's point and negative Murphy's sign.  Neurological: He  is alert.  Skin: Skin is warm and dry.  Psychiatric: He has a normal mood and affect.    ED Course  Procedures (including critical care time) Labs Review Labs Reviewed  HEPATIC FUNCTION PANEL  COMPREHENSIVE METABOLIC PANEL    Imaging Review [place] Abdomen Complete  05/17/2014   CLINICAL DATA:  Upper abdominal pain  EXAM: ULTRASOUND ABDOMEN COMPLETE  COMPARISON:  August 08, 2013  FINDINGS: Gallbladder:  Within the gallbladder, there is a 1.2 cm focus which moves in shadows consistent with cholelithiasis. There is also a 2 mm echogenic focus along the wall of the gallbladder which does some over shadow, upright representing a small polyp. There is borderline gallbladder wall thickening with edema in the wall. No sonographic Murphy sign noted.  Common bile duct:  Diameter: 4 mm. There is no intrahepatic, common hepatic, or common bile duct dilatation.  Liver:  No focal lesion identified. Within normal limits in parenchymal echogenicity.  IVC:  No abnormality visualized.  Pancreas:  No mass or inflammatory change. Pancreatic duct is borderline prominent without focal lesion seen in the pancreatic duct.  Spleen:  Size and appearance within normal limits.  Right Kidney:  Length: 12.1 cm. Echogenicity within normal limits. No mass or hydronephrosis visualized.  Left Kidney:  Length: 11.8 cm. Echogenicity within normal limits. No mass or hydronephrosis visualized.  Abdominal aorta:  No aneurysm visualized.  Other findings:  No demonstrable ascites.  IMPRESSION: Cholelithiasis as well as a small polyp in the gallbladder. There is edema in the gallbladder wall. A degree of acute cholecystitis is suspected.  Borderline prominence of the pancreatic duct. Pancreas otherwise appears normal.  Study otherwise unremarkable.   Electronically Signed   By: Bretta BangWilliam  Woodruff M.D.   On: 05/17/2014 19:36   Dg Abd Acute W/chest  05/17/2014   CLINICAL DATA:  Abdominal pain, vomiting  EXAM: ACUTE ABDOMEN SERIES (ABDOMEN 2 VIEW  & CHEST 1 VIEW)  COMPARISON:  None.  FINDINGS: There is no evidence of dilated bowel loops or free intraperitoneal air. No radiopaque calculi or other significant radiographic abnormality is seen. Heart size and mediastinal contours are within normal limits. Both lungs are clear.  IMPRESSION: Negative abdominal radiographs.  No acute cardiopulmonary disease.   Electronically Signed   By: Elige KoHetal  Patel   On: 05/17/2014 21:04     EKG Interpretation None      6:36 PM Patient seen and examined. [facility] Imaging ordered. Medications ordered.   Vital signs reviewed and are as follows: Filed Vitals:   05/17/14 1758  BP: 137/88  Pulse: 66  Temp: 97.6 F (36.4 C)  Resp: 18   Patient had US consistent with gallstones, ? Cholecystitis. Pain remains poorly controlled after IV pain medications. Discussed with Dr. Rhunette CroftNanavati. Will call surgery for consult.   Spoke with Dr. Lindie SpruceWyatt who will see.   Surgery has decided to admit.   MDM   Final diagnoses:  Right upper quadrant pain   Admit for above.     Renne CriglerJoshua Nthony Lefferts, PA-C 05/18/14 [phone]

## 2014-05-17 NOTE — ED Provider Notes (Signed)
CSN: [id number]     Arrival date & time 05/17/14  1447 History   None    Chief Complaint  Patient presents with  . Abdominal Pain   (Consider location/radiation/quality/duration/timing/severity/associated sxs/prior Treatment) Patient is a 38 y.o. male presenting with abdominal pain. The history is provided by the patient and the spouse.  Abdominal Pain Pain location:  Epigastric Pain quality: burning and cramping   Pain radiates to:  RUQ Pain severity:  Moderate Onset quality:  Sudden Duration:  1 hour Timing:  Constant Progression:  Worsening Chronicity:  New Context comment:  Sudden onset after lunch today, felt nl prior to onset. Relieved by:  None tried Associated symptoms: anorexia, belching, diarrhea and nausea   Associated symptoms: no chills, no cough, no fever, no hematemesis and no vomiting   Risk factors: no alcohol abuse   Risk factors comment:  H/o hpylori, no etoh use.   History reviewed. No pertinent past medical history. History reviewed. No pertinent past surgical history. History reviewed. No pertinent family history. History  Substance Use Topics  . Smoking status: Never Smoker   . Smokeless tobacco: Never Used  . Alcohol Use: No    Review of Systems  Constitutional: Negative.  Negative for fever and chills.  HENT: Negative.   Respiratory: Negative for cough.   Cardiovascular: Negative.   Gastrointestinal: Positive for nausea, abdominal pain, diarrhea and anorexia. Negative for vomiting, blood in stool, anal bleeding and hematemesis.    Allergies  Apple and Other  Home Medications   Prior to Admission medications   Medication Sig Start Date End Date Taking? Authorizing Provider  clarithromycin (BIAXIN) 500 MG tablet Take 1 tablet (500 mg total) by mouth 2 (two) times daily. 01/19/14   Barbaraann BarthelJames O Breen, MD  hydrOXYzine (VISTARIL) 25 MG capsule Take 1 capsule (25 mg total) by mouth 3 (three) times daily as needed. 03/31/14   Amber Nydia BoutonM Hairford, MD    omeprazole (PRILOSEC) 20 MG capsule Take 1 capsule (20 mg total) by mouth 2 (two) times daily. 01/19/14   Barbaraann BarthelJames O Breen, MD  ondansetron (ZOFRAN) 4 MG tablet Take 1 tablet (4 mg total) by mouth every 6 (six) hours as needed for nausea. 08/09/13   Shelva MajesticStephen O Hunter, MD  ranitidine (ZANTAC) 150 MG tablet Take 1 tablet (150 mg total) by mouth 2 (two) times daily. 09/15/13   Barbaraann BarthelJames O Breen, MD  triamcinolone ointment (KENALOG) 0.1 % Apply 1 application topically 2 (two) times daily. 03/31/14   Amber Nydia BoutonM Hairford, MD   BP 119/80  Pulse 68  Temp(Src) 97.5 F (36.4 C) (Oral)  Resp 22  SpO2 98% Physical Exam  Nursing note and vitals reviewed. Constitutional: He appears well-developed and well-nourished. He appears distressed.  HENT:  Head: Normocephalic.  Right Ear: External ear normal.  Left Ear: External ear normal.  Mouth/Throat: Oropharynx is clear and moist.  Eyes: Pupils are equal, round, and reactive to light.  Neck: Normal range of motion. Neck supple.  Cardiovascular: Normal heart sounds and intact distal pulses.   Pulmonary/Chest: Effort normal and breath sounds normal.  Abdominal: Soft. He exhibits no distension and no mass. Bowel sounds are decreased. There is no hepatosplenomegaly. There is tenderness in the epigastric area. There is no rigidity, no rebound, no guarding and no CVA tenderness.    Skin: Skin is warm and dry.    ED Course  Procedures (including critical care time) Labs Review Labs Reviewed  CBC WITH DIFFERENTIAL - Abnormal; Notable for the following:  Hemoglobin 12.9 (*)    HCT 37.8 (*)    All other components within normal limits  BASIC METABOLIC PANEL - Abnormal; Notable for the following:    Potassium 3.6 (*)    Glucose, Bld 113 (*)    All other components within normal limits  AMYLASE  LIPASE, BLOOD    Imaging Review No results found.   MDM   1. Abdominal pain, acute, epigastric    Sent for eval of sudden epigastric pain., improved with   Initial meds, now sx reoccurring.   Linna Hoff, MD 05/17/14 [phone]

## 2014-05-17 NOTE — ED Notes (Signed)
[facility], and had been doing well until just after meal at noon. Had had "10" pain w n/v,cramps

## 2014-05-17 NOTE — H&P (Signed)
Edward Lane is an 38 y.o. male.   Chief Complaint: Abdominal pain  HPI: Has had a prior episode of abdominal pain associated with nausea and vomiting.  Similar to this episode except [place] 6 months ago did not demonstrate actue cholecystitis or cholelithiasis.  Today he had stones and thickening o the GB wall.  History reviewed. No pertinent past medical history.  History reviewed. No pertinent past surgical history.  History reviewed. No pertinent family history. Social History:  reports that he has never smoked. He has never used smokeless tobacco. He reports that he does not drink alcohol or use illicit drugs.  Allergies:  Allergies  Allergen Reactions  . Apple     Unknown  . Other     Unknown reaction--tree nuts and another food (can't remember)     (Not in a hospital admission)  Results for orders placed during the hospital encounter of 05/17/14 (from the past 48 hour(s))  HEPATIC FUNCTION PANEL     Status: None   Collection Time    05/17/14  3:10 PM      Result Value Ref Range   Total Protein 7.3  6.0 - 8.3 g/dL   Albumin 3.7  3.5 - 5.2 g/dL   AST 29  0 - 37 U/L   ALT 21  0 - 53 U/L   Alkaline Phosphatase 109  39 - 117 U/L   Total Bilirubin 0.6  0.3 - 1.2 mg/dL   Bilirubin, Direct <1.9  0.0 - 0.3 mg/dL   Indirect Bilirubin NOT CALCULATED  0.3 - 0.9 mg/dL   US Abdomen Complete  05/17/2014   CLINICAL DATA:  Upper abdominal pain  EXAM: ULTRASOUND ABDOMEN COMPLETE  COMPARISON:  August 08, 2013  FINDINGS: Gallbladder:  Within the gallbladder, there is a 1.2 cm focus which moves in shadows consistent with cholelithiasis. There is also a 2 mm echogenic focus along the wall of the gallbladder which does some over shadow, upright representing a small polyp. There is borderline gallbladder wall thickening with edema in the wall. No sonographic Murphy sign noted.  Common bile duct:  Diameter: 4 mm. There is no intrahepatic, common hepatic, or common bile duct dilatation.  Liver:  No  focal lesion identified. Within normal limits in parenchymal echogenicity.  IVC:  No abnormality visualized.  Pancreas:  No mass or inflammatory change. Pancreatic duct is borderline prominent without focal lesion seen in the pancreatic duct.  Spleen:  Size and appearance within normal limits.  Right Kidney:  Length: 12.1 cm. Echogenicity within normal limits. No mass or hydronephrosis visualized.  Left Kidney:  Length: 11.8 cm. Echogenicity within normal limits. No mass or hydronephrosis visualized.  Abdominal aorta:  No aneurysm visualized.  Other findings:  No demonstrable ascites.  IMPRESSION: Cholelithiasis as well as a small polyp in the gallbladder. There is edema in the gallbladder wall. A degree of acute cholecystitis is suspected.  Borderline prominence of the pancreatic duct. Pancreas otherwise appears normal.  Study otherwise unremarkable.   Electronically Signed   By: Bretta Bang M.D.   On: 05/17/2014 19:36   Dg Abd Acute W/chest  05/17/2014   CLINICAL DATA:  Abdominal pain, vomiting  EXAM: ACUTE ABDOMEN SERIES (ABDOMEN 2 VIEW & CHEST 1 VIEW)  COMPARISON:  None.  FINDINGS: There is no evidence of dilated bowel loops or free intraperitoneal air. No radiopaque calculi or other significant radiographic abnormality is seen. Heart size and mediastinal contours are within normal limits. Both lungs are clear.  IMPRESSION: Negative abdominal  radiographs.  No acute cardiopulmonary disease.   Electronically Signed   By: Elige KoHetal  Patel   On: 05/17/2014 21:04    Review of Systems  Constitutional: Negative for fever and chills.  Gastrointestinal: Positive for nausea and abdominal pain.  All other systems reviewed and are negative.   Blood pressure 119/82, pulse 64, temperature 97 F (36.1 C), temperature source Oral, resp. rate 20, SpO2 98.00%. Physical Exam  Constitutional: He is oriented to person, place, and time. He appears well-developed and well-nourished.  HENT:  Head: Normocephalic and  atraumatic.  Eyes: Conjunctivae and EOM are normal. Pupils are equal, round, and reactive to light. No scleral icterus.  Neck: Normal range of motion. Neck supple.  Cardiovascular: Normal rate, regular rhythm and normal heart sounds.   Respiratory: Effort normal and breath sounds normal.  GI: Soft. Normal appearance. There is tenderness in the right upper quadrant and epigastric area. There is no rigidity, no rebound and no guarding.  Neurological: He is alert and oriented to person, place, and time. He has normal reflexes.  Skin: Skin is warm and dry.  Psychiatric: He has a normal mood and affect. His behavior is normal. Judgment and thought content normal.     Assessment/Plan Acute cholecystitis and biliary colic with cholelithiasis Admit for IV antibiotics, pain control, hydration and eventually cholecystectomy.  Cherylynn RidgesWYATT, Laysha Childers O 05/17/2014, 10:28 PM    Acute cholecystitis and biliary colic

## 2014-05-17 NOTE — ED Notes (Signed)
Per EMS: pt from urgent care with right-sided abdominal pain with radiation to mid abdomen.  Pt with nvd.  [facility] relief.  Pt given 1 mg of Dilaudid at 1527 and 4 mg of Zofran.  Pt started vomiting upon EMS arrival, pt given 4 more of Zofran.

## 2014-05-18 ENCOUNTER — Observation Stay (HOSPITAL_COMMUNITY): Payer: BC Managed Care – PPO | Admitting: Certified Registered"

## 2014-05-18 ENCOUNTER — Ambulatory Visit: Payer: Medicaid Other | Admitting: Family Medicine

## 2014-05-18 ENCOUNTER — Observation Stay (HOSPITAL_COMMUNITY): Payer: BC Managed Care – PPO

## 2014-05-18 ENCOUNTER — Encounter (HOSPITAL_COMMUNITY): Payer: Self-pay | Admitting: Certified Registered"

## 2014-05-18 ENCOUNTER — Encounter (HOSPITAL_COMMUNITY)
Admission: EM | Disposition: A | Discharge status: Home / Self Care | Payer: Self-pay | Source: Home / Self Care | Attending: Emergency Medicine

## 2014-05-18 ENCOUNTER — Encounter (HOSPITAL_COMMUNITY): Payer: BC Managed Care – PPO | Admitting: Certified Registered"

## 2014-05-18 DIAGNOSIS — K8000 Calculus of gallbladder with acute cholecystitis without obstruction: Secondary | ICD-10-CM | POA: Diagnosis not present

## 2014-05-18 HISTORY — PX: CHOLECYSTECTOMY: SHX55

## 2014-05-18 LAB — COMPREHENSIVE METABOLIC PANEL
ALT: 72 U/L — AB (ref 0–53)
AST: 85 U/L — AB (ref 0–37)
Albumin: 3.3 g/dL — ABNORMAL LOW (ref 3.5–5.2)
Alkaline Phosphatase: 99 U/L (ref 39–117)
Anion gap: 11 (ref 5–15)
BILIRUBIN TOTAL: 0.8 mg/dL (ref 0.3–1.2)
BUN: 7 mg/dL (ref 6–23)
CHLORIDE: 106 meq/L (ref 96–112)
CO2: 28 mEq/L (ref 19–32)
CREATININE: 1.02 mg/dL (ref 0.50–1.35)
Calcium: 8.6 mg/dL (ref 8.4–10.5)
GFR calc non Af Amer: >90 mL/min (ref >90)
Glucose, Bld: 121 mg/dL — ABNORMAL HIGH (ref 70–99)
Potassium: 4.1 mEq/L (ref 3.7–5.3)
SODIUM: 145 meq/L (ref 137–147)
Total Protein: 6.5 g/dL (ref 6.0–8.3)

## 2014-05-18 LAB — SURGICAL PCR SCREEN
MRSA, PCR: NEGATIVE
Staphylococcus aureus: NEGATIVE

## 2014-05-18 SURGERY — LAPAROSCOPIC CHOLECYSTECTOMY WITH INTRAOPERATIVE CHOLANGIOGRAM
Anesthesia: General | Site: Abdomen

## 2014-05-18 MED ORDER — SUCCINYLCHOLINE CHLORIDE 20 MG/ML IJ SOLN
INTRAMUSCULAR | Status: AC
  Filled 2014-05-18: qty 1

## 2014-05-18 MED ORDER — NEOSTIGMINE METHYLSULFATE 10 MG/10ML IV SOLN
INTRAVENOUS | Status: DC | PRN
  Administered 2014-05-18: 3 mg via INTRAVENOUS

## 2014-05-18 MED ORDER — LIDOCAINE HCL (CARDIAC) 20 MG/ML IV SOLN
INTRAVENOUS | Status: AC
  Filled 2014-05-18: qty 5

## 2014-05-18 MED ORDER — LACTATED RINGERS IV SOLN
INTRAVENOUS | Status: DC | PRN
  Administered 2014-05-18 (×2): via INTRAVENOUS

## 2014-05-18 MED ORDER — NEOSTIGMINE METHYLSULFATE 10 MG/10ML IV SOLN
INTRAVENOUS | Status: AC
Start: 2014-05-18 — End: 2014-05-18
  Filled 2014-05-18: qty 1

## 2014-05-18 MED ORDER — SODIUM CHLORIDE 0.9 % IR SOLN
Status: DC | PRN
  Administered 2014-05-18: 1000 mL

## 2014-05-18 MED ORDER — VECURONIUM BROMIDE 10 MG IV SOLR
INTRAVENOUS | Status: AC
  Filled 2014-05-18: qty 10

## 2014-05-18 MED ORDER — KETOROLAC TROMETHAMINE 30 MG/ML IJ SOLN
INTRAMUSCULAR | Status: DC | PRN
  Administered 2014-05-18: 30 mg via INTRAVENOUS

## 2014-05-18 MED ORDER — BUPIVACAINE-EPINEPHRINE (PF) 0.25% -1:200000 IJ SOLN
INTRAMUSCULAR | Status: AC
  Filled 2014-05-18: qty 30

## 2014-05-18 MED ORDER — ONDANSETRON HCL 4 MG/2ML IJ SOLN
INTRAMUSCULAR | Status: AC
  Filled 2014-05-18: qty 2

## 2014-05-18 MED ORDER — GLYCOPYRROLATE 0.2 MG/ML IJ SOLN
INTRAMUSCULAR | Status: AC
  Filled 2014-05-18: qty 2

## 2014-05-18 MED ORDER — ONDANSETRON HCL 4 MG/2ML IJ SOLN
INTRAMUSCULAR | Status: DC | PRN
  Administered 2014-05-18: 4 mg via INTRAVENOUS

## 2014-05-18 MED ORDER — MIDAZOLAM HCL 5 MG/5ML IJ SOLN
INTRAMUSCULAR | Status: DC | PRN
  Administered 2014-05-18: 2 mg via INTRAVENOUS

## 2014-05-18 MED ORDER — FENTANYL CITRATE 0.05 MG/ML IJ SOLN
INTRAMUSCULAR | Status: AC
  Filled 2014-05-18: qty 5

## 2014-05-18 MED ORDER — OXYCODONE HCL 5 MG/5ML PO SOLN: 5.0000 mg | Freq: Once | ORAL | Status: DC | PRN

## 2014-05-18 MED ORDER — FENTANYL CITRATE 0.05 MG/ML IJ SOLN
INTRAMUSCULAR | Status: DC | PRN
  Administered 2014-05-18: 50 ug via INTRAVENOUS
  Administered 2014-05-18: 150 ug via INTRAVENOUS
  Administered 2014-05-18: 50 ug via INTRAVENOUS

## 2014-05-18 MED ORDER — BUPIVACAINE-EPINEPHRINE 0.25% -1:200000 IJ SOLN
INTRAMUSCULAR | Status: DC | PRN
  Administered 2014-05-18: 20 mL

## 2014-05-18 MED ORDER — IOHEXOL 300 MG/ML  SOLN
INTRAMUSCULAR | Status: DC | PRN
  Administered 2014-05-18: 11:00:00

## 2014-05-18 MED ORDER — MORPHINE SULFATE 2 MG/ML IJ SOLN: 1.0000 mg | INTRAMUSCULAR | Status: DC | PRN

## 2014-05-18 MED ORDER — MIDAZOLAM HCL 2 MG/2ML IJ SOLN
INTRAMUSCULAR | Status: AC
  Filled 2014-05-18: qty 2

## 2014-05-18 MED ORDER — 0.9 % SODIUM CHLORIDE (POUR BTL) OPTIME
TOPICAL | Status: DC | PRN
  Administered 2014-05-18: 1000 mL

## 2014-05-18 MED ORDER — GLYCOPYRROLATE 0.2 MG/ML IJ SOLN
INTRAMUSCULAR | Status: DC | PRN
  Administered 2014-05-18: 0.4 mg via INTRAVENOUS

## 2014-05-18 MED ORDER — OXYCODONE HCL 5 MG PO TABS: 5.0000 mg | ORAL_TABLET | Freq: Once | ORAL | Status: DC | PRN

## 2014-05-18 MED ORDER — LIDOCAINE HCL (CARDIAC) 20 MG/ML IV SOLN
INTRAVENOUS | Status: DC | PRN
  Administered 2014-05-18: 40 mg via INTRAVENOUS

## 2014-05-18 MED ORDER — OXYCODONE-ACETAMINOPHEN 5-325 MG PO TABS
1.0000 | ORAL_TABLET | ORAL | Status: DC | PRN
  Administered 2014-05-18: 2 via ORAL
  Filled 2014-05-18 (×2): qty 2

## 2014-05-18 MED ORDER — SUCCINYLCHOLINE CHLORIDE 20 MG/ML IJ SOLN
INTRAMUSCULAR | Status: DC | PRN
  Administered 2014-05-18: 100 mg via INTRAVENOUS

## 2014-05-18 MED ORDER — HYDROMORPHONE HCL PF 1 MG/ML IJ SOLN
INTRAMUSCULAR | Status: AC
  Filled 2014-05-18: qty 1

## 2014-05-18 MED ORDER — PROPOFOL 10 MG/ML IV BOLUS
INTRAVENOUS | Status: DC | PRN
  Administered 2014-05-18: 200 mg via INTRAVENOUS

## 2014-05-18 MED ORDER — SODIUM CHLORIDE 0.9 % IJ SOLN
INTRAMUSCULAR | Status: AC
  Filled 2014-05-18: qty 10

## 2014-05-18 MED ORDER — PROPOFOL 10 MG/ML IV BOLUS
INTRAVENOUS | Status: AC
  Filled 2014-05-18: qty 20

## 2014-05-18 MED ORDER — HYDROMORPHONE HCL PF 1 MG/ML IJ SOLN
0.2500 mg | INTRAMUSCULAR | Status: DC | PRN
  Administered 2014-05-18: 0.25 mg via INTRAVENOUS

## 2014-05-18 MED ORDER — VECURONIUM BROMIDE 10 MG IV SOLR
INTRAVENOUS | Status: DC | PRN
  Administered 2014-05-18: 2 mg via INTRAVENOUS

## 2014-05-18 SURGICAL SUPPLY — 44 items
APL SKNCLS STERI-STRIP NONHPOA (GAUZE/BANDAGES/DRESSINGS) ×1
APPLIER CLIP 5 13 M/L LIGAMAX5 (MISCELLANEOUS) ×3
APR CLP MED LRG 5 ANG JAW (MISCELLANEOUS) ×1
BAG SPEC RTRVL LRG 6X4 10 (ENDOMECHANICALS) ×1
BANDAGE ADH SHEER 1  50/CT (GAUZE/BANDAGES/DRESSINGS) ×12 IMPLANT
BENZOIN TINCTURE PRP APPL 2/3 (GAUZE/BANDAGES/DRESSINGS) ×3 IMPLANT
CANISTER SUCTION 2500CC (MISCELLANEOUS) ×3 IMPLANT
CHLORAPREP W/TINT 26ML (MISCELLANEOUS) ×3 IMPLANT
CLIP APPLIE 5 13 M/L LIGAMAX5 (MISCELLANEOUS) ×1 IMPLANT
COVER MAYO STAND STRL (DRAPES) ×2 IMPLANT
COVER SURGICAL LIGHT HANDLE (MISCELLANEOUS) ×3 IMPLANT
DECANTER SPIKE VIAL GLASS SM (MISCELLANEOUS) ×1 IMPLANT
DRAPE C-ARM 42X72 X-RAY (DRAPES) ×2 IMPLANT
DRAPE UTILITY 15X26 W/TAPE STR (DRAPE) ×6 IMPLANT
ELECT REM PT RETURN 9FT ADLT (ELECTROSURGICAL) ×3
ELECTRODE REM PT RTRN 9FT ADLT (ELECTROSURGICAL) ×1 IMPLANT
GLOVE BIOGEL PI IND STRL 7.0 (GLOVE) IMPLANT
GLOVE BIOGEL PI IND STRL 7.5 (GLOVE) IMPLANT
GLOVE BIOGEL PI INDICATOR 7.0 (GLOVE) ×6
GLOVE BIOGEL PI INDICATOR 7.5 (GLOVE) ×2
GLOVE SS BIOGEL STRL SZ 6.5 (GLOVE) IMPLANT
GLOVE SUPERSENSE BIOGEL SZ 6.5 (GLOVE) ×2
GLOVE SURG SIGNA 7.5 PF LTX (GLOVE) ×3 IMPLANT
GLOVE SURG SS PI 7.0 STRL IVOR (GLOVE) ×4 IMPLANT
GOWN STRL REUS W/ TWL LRG LVL3 (GOWN DISPOSABLE) ×3 IMPLANT
GOWN STRL REUS W/ TWL XL LVL3 (GOWN DISPOSABLE) ×1 IMPLANT
GOWN STRL REUS W/TWL LRG LVL3 (GOWN DISPOSABLE) ×9
GOWN STRL REUS W/TWL XL LVL3 (GOWN DISPOSABLE) ×3
KIT BASIN OR (CUSTOM PROCEDURE TRAY) ×3 IMPLANT
KIT ROOM TURNOVER OR (KITS) ×3 IMPLANT
NS IRRIG 1000ML POUR BTL (IV SOLUTION) ×3 IMPLANT
PAD ARMBOARD 7.5X6 YLW CONV (MISCELLANEOUS) ×3 IMPLANT
POUCH SPECIMEN RETRIEVAL 10MM (ENDOMECHANICALS) ×2 IMPLANT
SCISSORS LAP 5X35 DISP (ENDOMECHANICALS) ×3 IMPLANT
SET CHOLANGIOGRAPH 5 50 .035 (SET/KITS/TRAYS/PACK) ×2 IMPLANT
SET IRRIG TUBING LAPAROSCOPIC (IRRIGATION / IRRIGATOR) ×3 IMPLANT
SLEEVE ENDOPATH XCEL 5M (ENDOMECHANICALS) ×8 IMPLANT
SPECIMEN JAR SMALL (MISCELLANEOUS) ×3 IMPLANT
SUT MON AB 4-0 PC3 18 (SUTURE) ×3 IMPLANT
TOWEL OR 17X24 6PK STRL BLUE (TOWEL DISPOSABLE) ×3 IMPLANT
TOWEL OR 17X26 10 PK STRL BLUE (TOWEL DISPOSABLE) ×3 IMPLANT
TRAY LAPAROSCOPIC (CUSTOM PROCEDURE TRAY) ×3 IMPLANT
TROCAR XCEL BLUNT TIP 100MML (ENDOMECHANICALS) ×3 IMPLANT
TROCAR XCEL NON-BLD 5MMX100MML (ENDOMECHANICALS) ×3 IMPLANT

## 2014-05-18 NOTE — Anesthesia Postprocedure Evaluation (Signed)
  Anesthesia Post-op Note  Patient: Edward Lane  Procedure(s) Performed: Procedure(s): LAPAROSCOPIC CHOLECYSTECTOMY WITH INTRAOPERATIVE CHOLANGIOGRAM (N/A)  Patient Location: PACU  Anesthesia Type:General  Level of Consciousness: awake and alert   Airway and Oxygen Therapy: Patient Spontanous Breathing  Post-op Pain: mild  Post-op Assessment: Post-op Vital signs reviewed, Patient's Cardiovascular Status Stable and Respiratory Function Stable  Post-op Vital Signs: Reviewed  Filed Vitals:   05/18/14 1145  BP: 128/82  Pulse: 48  Temp:   Resp: 14    Complications: No apparent anesthesia complications

## 2014-05-18 NOTE — Progress Notes (Signed)
Patient ID: Edward Lane, male   DOB: 12/31/1975, 38 y.o.   MRN: [id number]  Pt still with pain from acute cholecystitis with cholelithiasis  Plan lap chole with c-gram today. I discussed the procedure in detail.    We discussed the risks and benefits of a laparoscopic cholecystectomy and possible cholangiogram including, but not limited to bleeding, infection, injury to surrounding structures such as the intestine or liver, bile leak, retained gallstones, need to convert to an open procedure, prolonged diarrhea, blood clots such as  DVT, common bile duct injury, anesthesia risks, and possible need for additional procedures.  The likelihood of improvement in symptoms and return to the patient's normal status is good. We discussed the typical post-operative recovery course.

## 2014-05-18 NOTE — Op Note (Signed)
Laparoscopic Cholecystectomy with IOC Procedure Note  Indications: This patient presents with symptomatic gallbladder disease and will undergo laparoscopic cholecystectomy.  Pre-operative Diagnosis: Calculus of gallbladder with acute cholecystitis, without mention of obstruction  Post-operative Diagnosis: Same  Surgeon: Abigail MiyamotoBLACKMAN,Jesten Cappuccio A   Assistants: 0  Anesthesia: General endotracheal anesthesia  ASA Class: 1  Procedure Details  The patient was seen again in the Holding Room. The risks, benefits, complications, treatment options, and expected outcomes were discussed with the patient. The possibilities of reaction to medication, pulmonary aspiration, perforation of viscus, bleeding, recurrent infection, finding a normal gallbladder, the need for additional procedures, failure to diagnose a condition, the possible need to convert to an open procedure, and creating a complication requiring transfusion or operation were discussed with the patient. The likelihood of improving the patient's symptoms with return to their baseline status is good.  The patient and/or family concurred with the proposed plan, giving informed consent. The site of surgery properly noted. The patient was taken to Operating Room, identified as Verdie DrownKelvin Lamoureaux and the procedure verified as Laparoscopic Cholecystectomy with Intraoperative Cholangiogram. A Time Out was held and the above information confirmed.  Prior to the induction of general anesthesia, antibiotic prophylaxis was administered. General endotracheal anesthesia was then administered and tolerated well. After the induction, the abdomen was prepped with Chloraprep and draped in the sterile fashion. The patient was positioned in the supine position.  Local anesthetic agent was injected into the skin near the umbilicus and an incision made. We dissected down to the abdominal fascia with blunt dissection.  The fascia was incised vertically and we entered the  peritoneal cavity bluntly.  A pursestring suture of 0-Vicryl was placed around the fascial opening.  The Hasson cannula was inserted and secured with the stay suture.  Pneumoperitoneum was then created with CO2 and tolerated well without any adverse changes in the patient's vital signs. An 11-mm port was placed in the subxiphoid position.  Two 5-mm ports were placed in the right upper quadrant. All skin incisions were infiltrated with a local anesthetic agent before making the incision and placing the trocars.   We positioned the patient in reverse Trendelenburg, tilted slightly to the patient's left.  The gallbladder was identified, the fundus grasped and retracted cephalad. Adhesions were lysed bluntly and with the electrocautery where indicated, taking care not to injure any adjacent organs or viscus. The infundibulum was grasped and retracted laterally, exposing the peritoneum overlying the triangle of Calot. This was then divided and exposed in a blunt fashion. A critical view of the cystic duct and cystic artery was obtained.  The cystic duct was clearly identified and bluntly dissected circumferentially. The cystic duct was ligated with a clip distally.   An incision was made in the cystic duct and the [facility] introduced. The catheter was secured using a clip. A cholangiogram was then obtained which showed good visualization of the distal and proximal biliary tree with no sign of filling defects or obstruction.  Contrast flowed easily into the duodenum. The catheter was then removed.   The cystic duct was then ligated with clips and divided. The cystic artery was identified, dissected free, ligated with clips and divided as well.   The gallbladder was dissected from the liver bed in retrograde fashion with the electrocautery. The gallbladder was removed and placed in an Endocatch sac. The liver bed was irrigated and inspected. Hemostasis was achieved with the electrocautery. Copious  irrigation was utilized and was repeatedly aspirated until clear.  The gallbladder and Endocatch sac were then removed through the umbilical port site.  The pursestring suture was used to close the umbilical fascia.    We again inspected the right upper quadrant for hemostasis.  Pneumoperitoneum was released as we removed the trocars.  4-0 Monocryl was used to close the skin.   Benzoin, steri-strips, and clean dressings were applied. The patient was then extubated and brought to the recovery room in stable condition. Instrument, sponge, and needle counts were correct at closure and at the conclusion of the case.   Findings: Cholecystitis with Cholelithiasis  Estimated Blood Loss: Minimal         Drains: 0         Specimens: Gallbladder           Complications: None; patient tolerated the procedure well.         Disposition: PACU - hemodynamically stable.         Condition: stable

## 2014-05-18 NOTE — Anesthesia Procedure Notes (Signed)
Procedure Name: Intubation Date/Time: 05/18/2014 10:17 AM Performed by: Arlice ColtMANESS, Zyairah Wacha B Pre-anesthesia Checklist: Patient identified, Emergency Drugs available, Suction available, Patient being monitored and Timeout performed Patient Re-evaluated:Patient Re-evaluated prior to inductionOxygen Delivery Method: Circle system utilized Preoxygenation: Pre-oxygenation with 100% oxygen Intubation Type: IV induction and Rapid sequence Laryngoscope Size: Mac and 3 Grade View: Grade I Tube type: Oral Tube size: 7.5 mm Number of attempts: 1 Airway Equipment and Method: Stylet Placement Confirmation: ETT inserted through vocal cords under direct vision,  positive ETCO2 and breath sounds checked- equal and bilateral Secured at: 23 cm Tube secured with: Tape Dental Injury: Teeth and Oropharynx as per pre-operative assessment

## 2014-05-18 NOTE — Anesthesia Preprocedure Evaluation (Addendum)
Anesthesia Evaluation  Patient identified by MRN, date of birth, ID band Patient awake    Reviewed: Allergy & Precautions, H&P , NPO status , Patient's Chart, lab work & pertinent test results  Airway Mallampati: I TM Distance: >3 FB Neck ROM: Full    Dental no notable dental hx. (+) Teeth Intact, Dental Advisory Given   Pulmonary neg pulmonary ROS,  breath sounds clear to auscultation  Pulmonary exam normal       Cardiovascular negative cardio ROS  Rhythm:Regular Rate:Normal     Neuro/Psych negative neurological ROS  negative psych ROS   GI/Hepatic negative GI ROS, Neg liver ROS,   Endo/Other  negative endocrine ROS  Renal/GU negative Renal ROS  negative genitourinary   Musculoskeletal   Abdominal   Peds  Hematology negative hematology ROS (+)   Anesthesia Other Findings   Reproductive/Obstetrics negative OB ROS                         Anesthesia Physical Anesthesia Plan  ASA: II  Anesthesia Plan: General   Post-op Pain Management:    Induction: Intravenous  Airway Management Planned: Oral ETT  Additional Equipment:   Intra-op Plan:   Post-operative Plan: Extubation in OR  Informed Consent: I have reviewed the patients History and Physical, chart, labs and discussed the procedure including the risks, benefits and alternatives for the proposed anesthesia with the patient or authorized representative who has indicated his/her understanding and acceptance.   Dental advisory given  Plan Discussed with: CRNA  Anesthesia Plan Comments:         Anesthesia Quick Evaluation

## 2014-05-18 NOTE — ED Provider Notes (Signed)
Medical screening examination/treatment/procedure(s) were performed by non-physician practitioner and as supervising physician I was immediately available for consultation/collaboration.   EKG Interpretation None       Garlin Batdorf, MD 05/18/14 0136 

## 2014-05-18 NOTE — Transfer of Care (Signed)
Immediate Anesthesia Transfer of Care Note  Patient: Edward Lane  Procedure(s) Performed: Procedure(s): LAPAROSCOPIC CHOLECYSTECTOMY WITH INTRAOPERATIVE CHOLANGIOGRAM (N/A)  Patient Location: PACU  Anesthesia Type:General  Level of Consciousness: awake, alert  and patient cooperative  Airway & Oxygen Therapy: Patient Spontanous Breathing and Patient connected to nasal cannula oxygen  Post-op Assessment: Report given to PACU RN and Post -op Vital signs reviewed and stable  Post vital signs: Reviewed and stable  Complications: No apparent anesthesia complications

## 2014-05-19 DIAGNOSIS — K8000 Calculus of gallbladder with acute cholecystitis without obstruction: Secondary | ICD-10-CM | POA: Diagnosis not present

## 2014-05-19 MED ORDER — SIMETHICONE 80 MG PO CHEW
160.0000 mg | CHEWABLE_TABLET | Freq: Four times a day (QID) | ORAL | Status: DC | PRN
  Administered 2014-05-19: 160 mg via ORAL
  Filled 2014-05-19 (×2): qty 2

## 2014-05-19 MED ORDER — OXYCODONE-ACETAMINOPHEN 5-325 MG PO TABS: 1.0000 | ORAL_TABLET | ORAL | Status: DC | PRN

## 2014-05-19 NOTE — Discharge Instructions (Signed)
[facility] ______[facility] °LAPAROSCOPIC SURGERY: POST OP INSTRUCTIONS °Always review your discharge instruction sheet given to you by the facility where your surgery was performed. °IF YOU HAVE DISABILITY OR FAMILY LEAVE FORMS, YOU MUST BRING THEM TO THE OFFICE FOR PROCESSING.   °DO NOT GIVE THEM TO YOUR DOCTOR. ° °1. A prescription for pain medication may be given to you upon discharge.  Take your pain medication as prescribed, if needed.  If narcotic pain medicine is not needed, then you may take acetaminophen (Tylenol) or ibuprofen (Advil) as needed. °2. Take your usually prescribed medications unless otherwise directed. °3. If you need a refill on your pain medication, please contact your pharmacy.  They will contact our office to request authorization. Prescriptions will not be filled after 5pm or on week-ends. °4. You should follow a light diet the first few days after arrival home, such as soup and crackers, etc.  Be sure to include lots of fluids daily. °5. Most patients will experience some swelling and bruising in the area of the incisions.  Ice packs will help.  Swelling and bruising can take several days to resolve.  °6. It is common to experience some constipation if taking pain medication after surgery.  Increasing fluid intake and taking a stool softener (such as Colace) will usually help or prevent this problem from occurring.  A mild laxative (Milk of Magnesia or Miralax) should be taken according to package instructions if there are no bowel movements after 48 hours. °7. Unless discharge instructions indicate otherwise, you may remove your bandages 24-48 hours after surgery, and you may shower at that time.  You may have steri-strips (small skin tapes) in place directly over the incision.  These strips should be left on the skin for 7-10 days.  If your surgeon used skin glue on the incision, you may shower in 24 hours.  The glue will flake off over the next 2-3 weeks.  Any sutures or  staples will be removed at the office during your follow-up visit. °8. ACTIVITIES:  You may resume regular (light) daily activities beginning the next day--such as daily self-care, walking, climbing stairs--gradually increasing activities as tolerated.  You may have sexual intercourse when it is comfortable.  Refrain from any heavy lifting or straining until approved by your doctor. °a. You may drive when you are no longer taking prescription pain medication, you can comfortably wear a seatbelt, and you can safely maneuver your car and apply brakes. °b. RETURN TO WORK:  __________________________________________________________ °9. You should see your doctor in the office for a follow-up appointment approximately 2-3 weeks after your surgery.  Make sure that you call for this appointment within a day or two after you arrive home to insure a convenient appointment time. °10. OTHER INSTRUCTIONS: __________________________________________________________________________________________________________________________ __________________________________________________________________________________________________________________________ °WHEN TO CALL YOUR DOCTOR: °1. Fever over 101.0 °2. Inability to urinate °3. Continued bleeding from incision. °4. Increased pain, redness, or drainage from the incision. °5. Increasing abdominal pain ° °The clinic staff is available to answer your questions during regular business hours.  Please don’t hesitate to call and ask to speak to one of the nurses for clinical concerns.  If you have a medical emergency, go to the nearest emergency room or call 911.  A surgeon from [facility] is always on call at the hospital. °[address], [address], [place], [place]  [address] ? [address] [address], [place], [place]   [address] °[phone] ? [phone] ? [phone] [phone] °Web site:   [email or web address] °

## 2014-05-19 NOTE — Discharge Planning (Signed)
Asked to see pt and wife by pt's RN. Pt's wife extremely upset that her husband has not been given a walker at discharge. Explained to her that pt has not been seen by PT and/or ordered a walker. According to nursing staff, pt has been independently mobile during stay. Pt's wife asks to view electronic chart. I explain to her that this is a HIPAA violation and that the proper procedure would be to request his record from our HIM department. Offer to give her direct phone number, which she declines. She states that she is a Psychologist, occupational"medical professional" and that she is "very familiar" with HIPAA. She asks to speak with my supervisor and pt's MD. Dr. Dwain SarnaWakefield going into surgery, hence unable to see family at this time. Tana Conchon Flack, [facility] notified. He is also unable to see pt at this time, but can come closer to 7:00. Information relayed to pt and wife as they are leaving the unit. Pt's wife turns around to state "You've just given me all of the information I need."

## 2014-05-19 NOTE — Discharge Summary (Signed)
Patient ID: Edward DrownKelvin Lane MRN: [id number] DOB/AGE: 39/06/1976 39 y.o.  Admit date: 05/17/2014 Discharge date: 05/19/2014  Procedures: Laparoscopic cholecystectomy with intraoperative cholangiogram  Consults: None  Reason for Admission: Has had a prior episode of abdominal pain associated with nausea and vomiting. Similar to this episode except US 6 months ago did not demonstrate actue cholecystitis or cholelithiasis. Today he had stones and thickening o the GB wall.  Admission Diagnoses:  1. acute cholecystitis  Hospital Course: The patient was admitted and taken to the OR later that day for a laparoscopic cholecystectomy with intraoperative cholangiogram. The patient tolerated the procedure well. On postoperative day 1, he was tolerating a regular diet, his pain was controlled, and he was voiding well. The patient was felt stable for discharge home at this time.  PE: Abdomen: Soft, appropriately tender, nondistended, incisions are clean, dry, intact with Steri-Strips and Band-Aids present.  Discharge Diagnoses:  Active Problems:   Acute cholecystitis  status post laparoscopic cholecystectomy with IOC  Discharge Medications:   Medication List         oxyCODONE-acetaminophen 5-325 MG per tablet  Commonly known as:  PERCOCET/ROXICET  Take 1-2 tablets by mouth every 4 (four) hours as needed for severe pain.        Discharge Instructions:     Follow-up Information   Follow up with [facility] On 06/08/2014. (1:30 PM, arrived by 1:00 PM for paperwork)    Contact information:   [address]   [place] [address] [phone]       Signed: Letha CapeOSBORNE,Gracelynn Bircher E 05/19/2014, 9:53 AM

## 2014-05-19 NOTE — Progress Notes (Signed)
Utilization review completed.  

## 2014-05-19 NOTE — Discharge Summary (Signed)
I have seen and examined the patient and agree with the assessment and plans.  Ceira Hoeschen A. Abdullahi Vallone  MD, FACS  

## 2014-05-21 ENCOUNTER — Encounter (HOSPITAL_COMMUNITY): Payer: Self-pay | Admitting: Surgery

## 2014-06-08 ENCOUNTER — Ambulatory Visit (INDEPENDENT_AMBULATORY_CARE_PROVIDER_SITE_OTHER): Payer: Medicaid Other | Admitting: General Surgery

## 2014-06-08 ENCOUNTER — Encounter (INDEPENDENT_AMBULATORY_CARE_PROVIDER_SITE_OTHER): Payer: Self-pay

## 2014-06-08 VITALS — BP 118/78 | HR 72 | Temp 98.1°F | Resp 14 | Ht 72.0 in | Wt 255.4 lb

## 2014-06-08 DIAGNOSIS — K8010 Calculus of gallbladder with chronic cholecystitis without obstruction: Secondary | ICD-10-CM

## 2014-06-08 NOTE — Patient Instructions (Signed)
Call us if you need us.  If you continue to have trouble with having to have BM with meals call your primary care doctor after 4-5 more weeks for follow up.

## 2014-06-08 NOTE — Progress Notes (Signed)
[facility] 04/25/1976 [id number] 06/08/2014   Verdie DrownKelvin Kertesz is a 38 y.o. male who had a laparoscopic cholecystectomy with intraoperative cholangiogram by Dr. Abigail Miyamotoouglas Blackman.  The pathology report confirmed  Gallbladder - CHRONIC CHOLECYSTITIS. - CHOLELITHIASIS.   The patient reports that they are feeling well with normal bowel movements and good appetite.  The pre-operative symptoms of abdominal pain, nausea, and vomiting have resolved.    Physical examination -  BP 118/78  Pulse 72  Temp(Src) 98.1 F (36.7 C) (Oral)  Resp 14  Ht 6' (1.829 m)  Wt 115.849 kg (255 lb 6.4 oz)  BMI 34.63 kg/m2  Incisions appear well-healed with no sign of infection or bleeding.   Abdomen - soft, non-tender  Impression:  s/p laparoscopic cholecystectomy  Plan:  He may resume a regular diet and full activity.  He may follow-up on a PRN basis.

## 2015-06-10 ENCOUNTER — Encounter: Payer: Self-pay | Admitting: Family Medicine

## 2015-06-10 ENCOUNTER — Ambulatory Visit (INDEPENDENT_AMBULATORY_CARE_PROVIDER_SITE_OTHER): Payer: Medicaid Other | Admitting: Family Medicine

## 2015-06-10 VITALS — BP 122/80 | HR 83 | Temp 97.7°F | Ht 72.0 in | Wt 254.1 lb

## 2015-06-10 DIAGNOSIS — Z13220 Encounter for screening for lipoid disorders: Secondary | ICD-10-CM | POA: Diagnosis present

## 2015-06-10 DIAGNOSIS — S91332A Puncture wound without foreign body, left foot, initial encounter: Secondary | ICD-10-CM

## 2015-06-10 DIAGNOSIS — Z23 Encounter for immunization: Secondary | ICD-10-CM

## 2015-06-10 DIAGNOSIS — Z114 Encounter for screening for human immunodeficiency virus [HIV]: Secondary | ICD-10-CM | POA: Insufficient documentation

## 2015-06-10 NOTE — Assessment & Plan Note (Signed)
No risk factors.  Will do one time screen

## 2015-06-10 NOTE — Assessment & Plan Note (Signed)
No record of previous screening.  Will test.

## 2015-06-10 NOTE — Patient Instructions (Signed)
You are doing great!  Your Tdap (tetnus shot) will be good for 10 years from today 06/10/15. We also took some blood to screen for your cholesterol and also HIV. We will call you with the results if concerning.

## 2015-06-10 NOTE — Assessment & Plan Note (Signed)
No evidence of infection.  Update tetanus

## 2015-06-10 NOTE — Progress Notes (Signed)
   Subjective:    Patient ID: Edward Lane, male    DOB: 21-Jan-1976, 39 y.o.   MRN: [id number]  HPI Stepped on metal, left heal.  Feels fine.  Here for tetanus. Also 1. Never screened for HIV - agrees to screening 2. Never tested cholesterol. - agrees to testing.       Review of Systems     Objective:   Physical Exam Left heal - no tenderness, well healed puncture wound.       Assessment & Plan:

## 2015-06-11 LAB — HIV ANTIBODY (ROUTINE TESTING W REFLEX): HIV 1&2 Ab, 4th Generation: NONREACTIVE

## 2015-06-11 LAB — LIPID PANEL
CHOLESTEROL: 159 mg/dL (ref 125–200)
HDL: 42 mg/dL (ref >=40)
LDL Cholesterol: 93 mg/dL (ref <130)
TRIGLYCERIDES: 120 mg/dL (ref <150)
Total CHOL/HDL Ratio: 3.8 Ratio (ref <=5.0)
VLDL: 24 mg/dL (ref <30)

## 2015-06-14 ENCOUNTER — Encounter: Payer: Self-pay | Admitting: Family Medicine

## 2015-08-22 IMAGING — US US ABDOMEN COMPLETE
1 series · 13 of 25 positions shown · non-contrast
Comparison: August 08, 2013

CLINICAL DATA: Upper abdominal pain

EXAM:
ULTRASOUND ABDOMEN COMPLETE

[Series 1: us abdomen complete · 0.27mm/px · 13 of 82 slices shown]
[im 1/82]
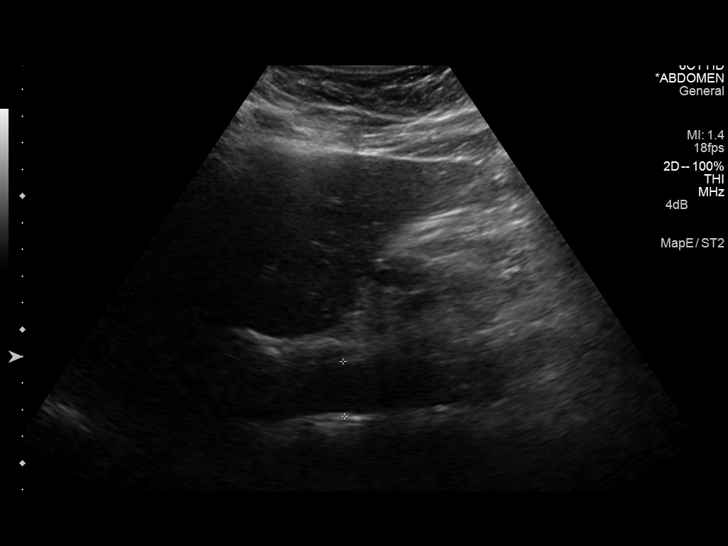
[im 7/82]
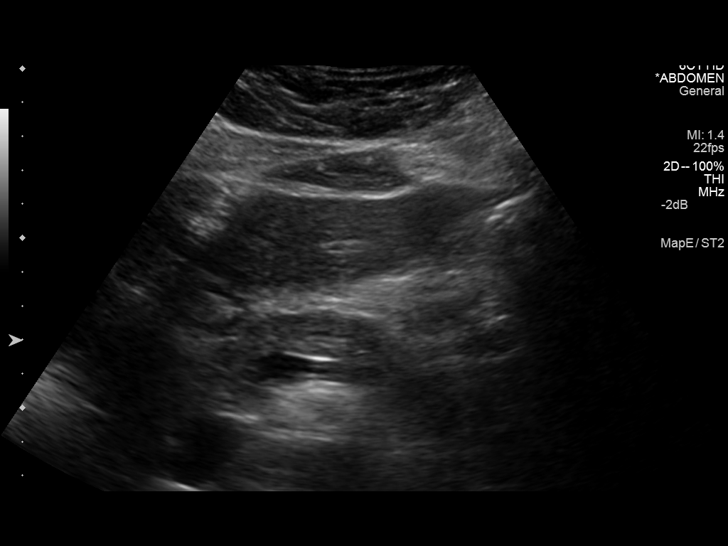
[im 14/82]
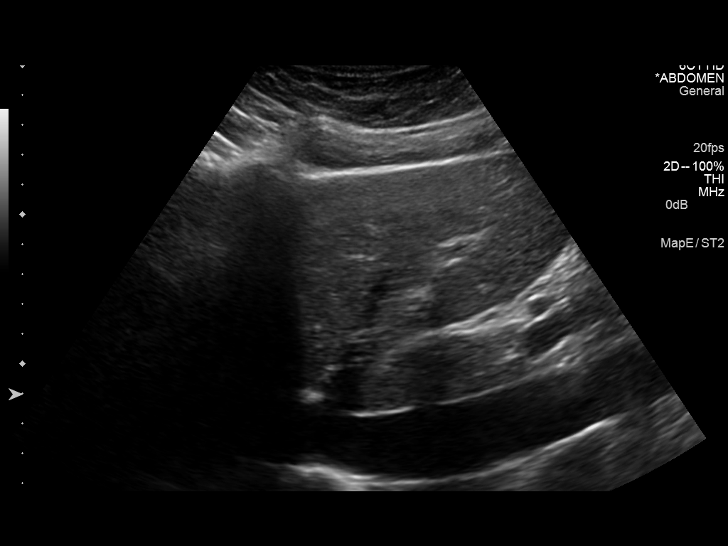
[im 21/82]
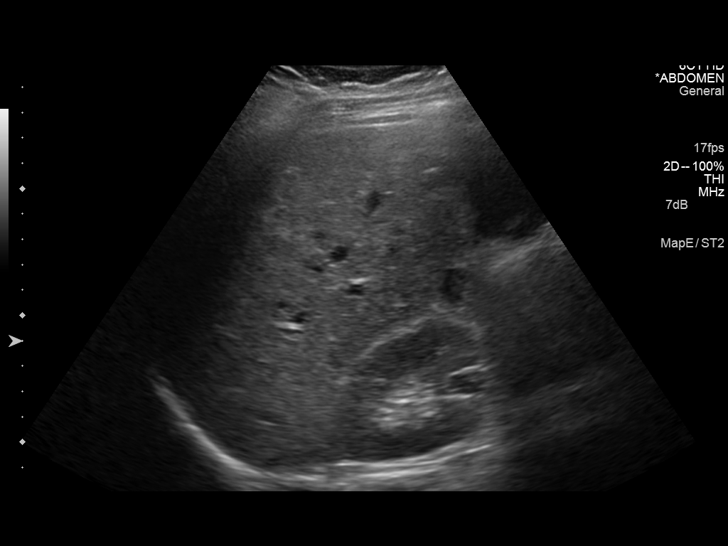
[im 28/82]
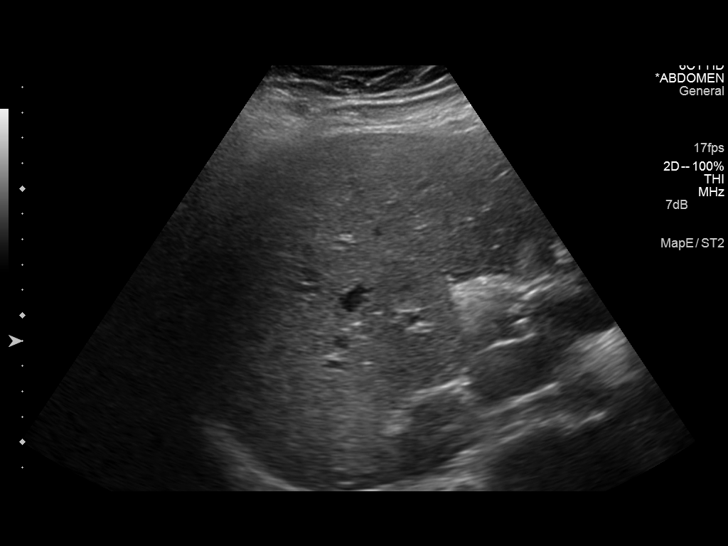
[im 34/82]
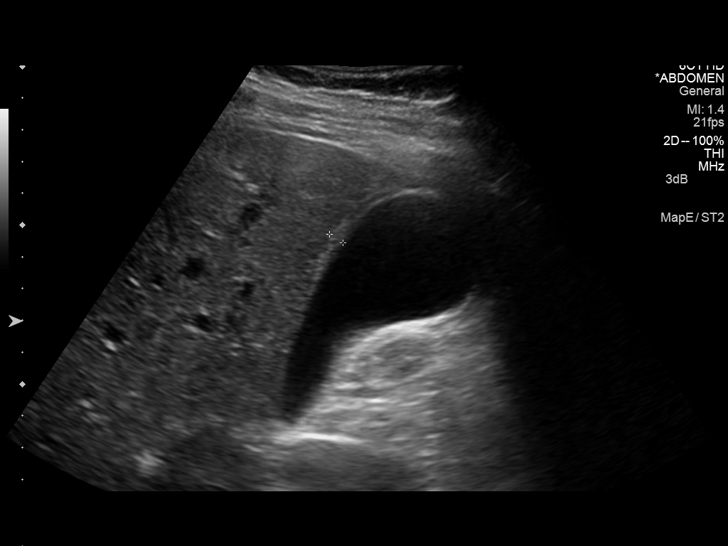
[im 41/82]
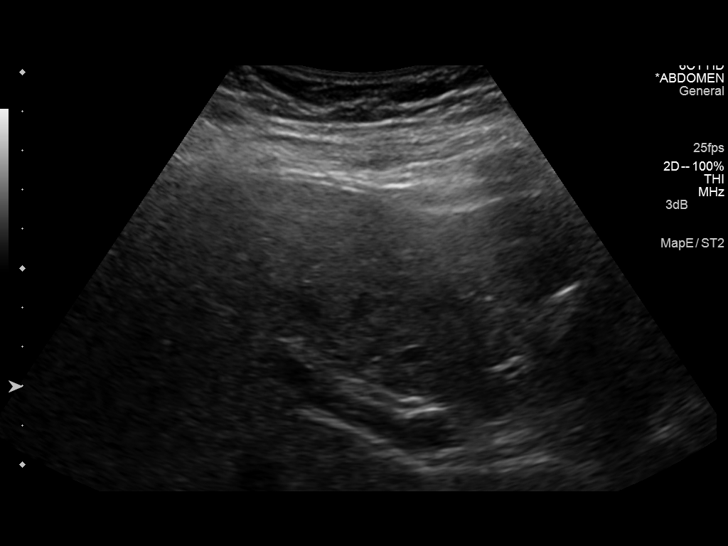
[im 48/82]
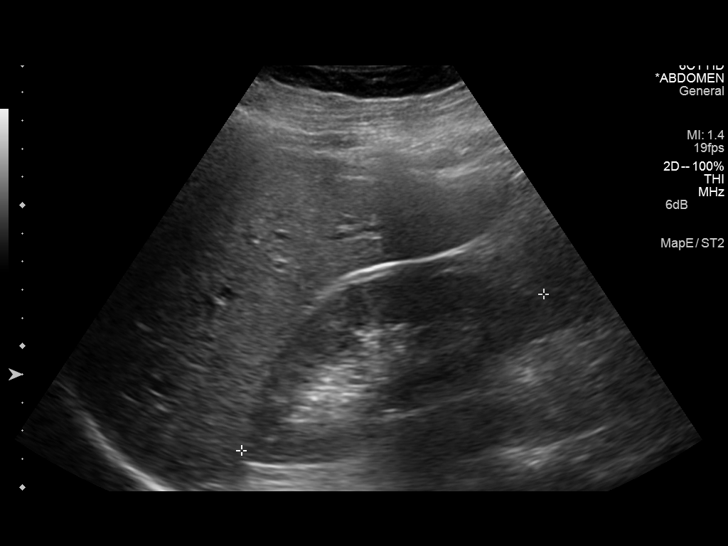
[im 55/82]
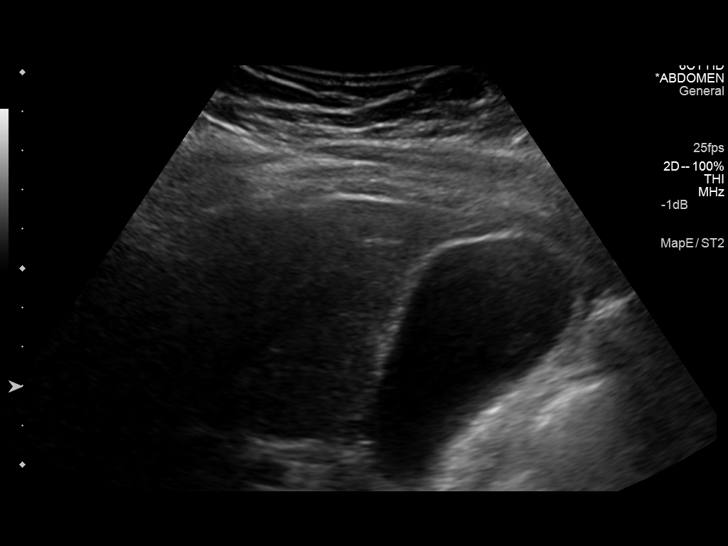
[im 61/82]
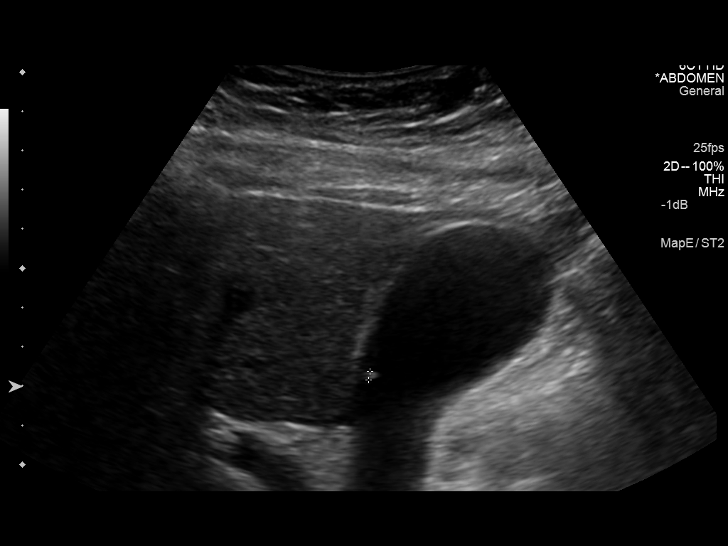
[im 68/82]
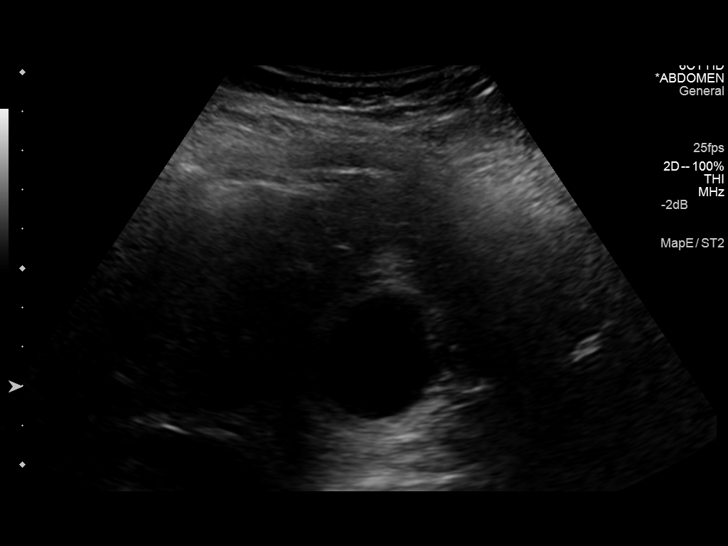
[im 75/82]
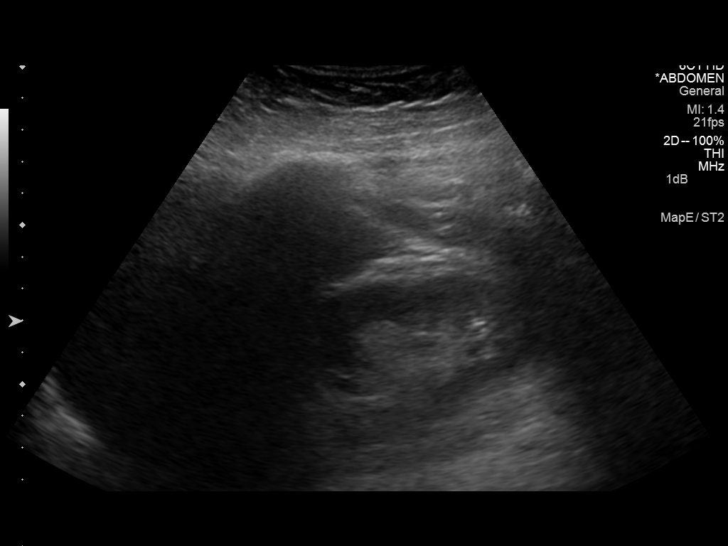
[im 82/82]
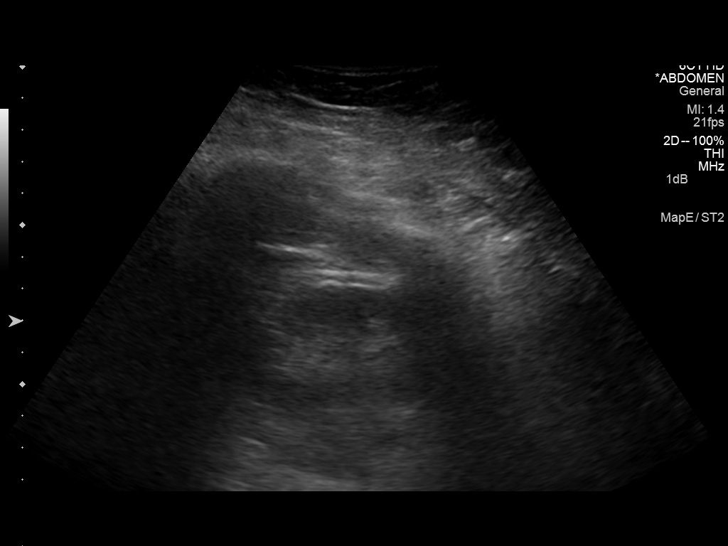

[13 of 25 positions shown; findings below may reference images not displayed]

FINDINGS: Gallbladder:

Within the gallbladder, there is a 1.2 cm focus which moves in
shadows consistent with cholelithiasis. There is also a 2 mm
echogenic focus along the wall of the gallbladder which does some
over shadow, upright representing a small polyp. There is borderline
gallbladder wall thickening with edema in the wall. No sonographic
Murphy sign noted.

Common bile duct:

Diameter: 4 mm. There is no intrahepatic, common hepatic, or common
bile duct dilatation.

Liver:

No focal lesion identified. Within normal limits in parenchymal
echogenicity.

IVC:

No abnormality visualized.

Pancreas:

No mass or inflammatory change. Pancreatic duct is borderline
prominent without focal lesion seen in the pancreatic duct.

Spleen:

Size and appearance within normal limits.

Right Kidney:

Length: 12.1 cm. Echogenicity within normal limits. No mass or
hydronephrosis visualized.

Left Kidney:

Length: 11.8 cm. Echogenicity within normal limits. No mass or
hydronephrosis visualized.

Abdominal aorta:

No aneurysm visualized.

Other findings:

No demonstrable ascites.
IMPRESSION: Cholelithiasis as well as a small polyp in the gallbladder. There is
edema in the gallbladder wall. A degree of acute cholecystitis is
suspected.

Borderline prominence of the pancreatic duct. Pancreas otherwise
appears normal.

Study otherwise unremarkable.

## 2015-08-22 IMAGING — CR DG ABDOMEN ACUTE W/ 1V CHEST
3 series · 3 of 3 positions shown · non-contrast
Comparison: None.

CLINICAL DATA: Abdominal pain, vomiting

EXAM:
ACUTE ABDOMEN SERIES (ABDOMEN 2 VIEW & CHEST 1 VIEW)

[w chest pa]
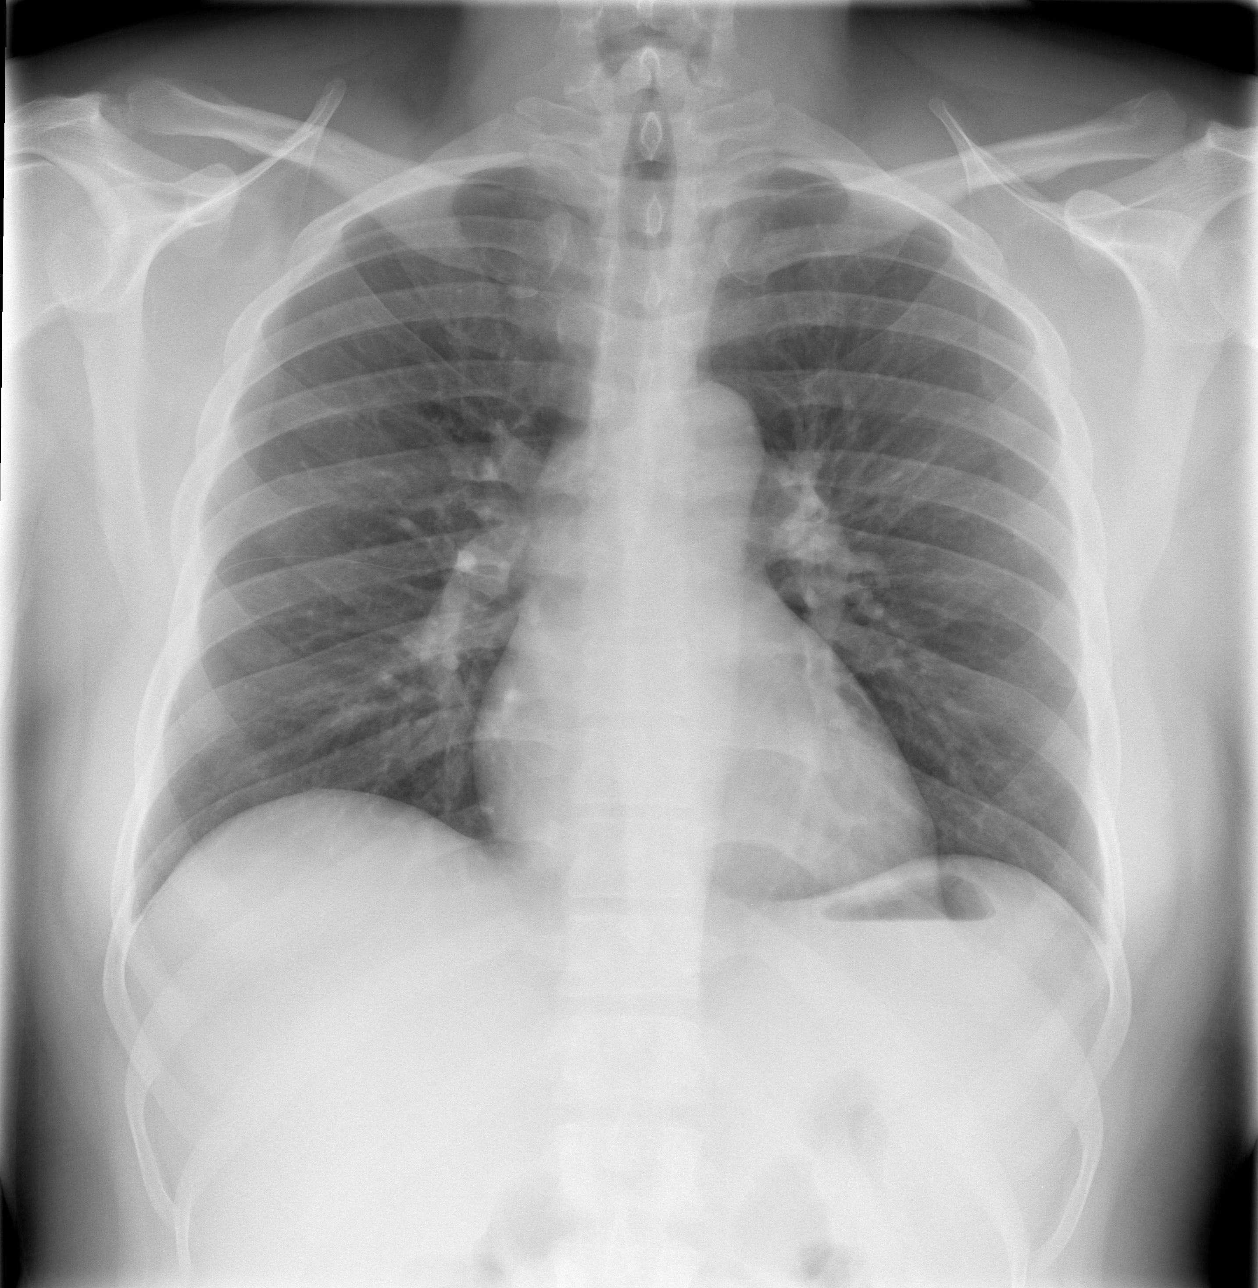

[w abdomen upright]
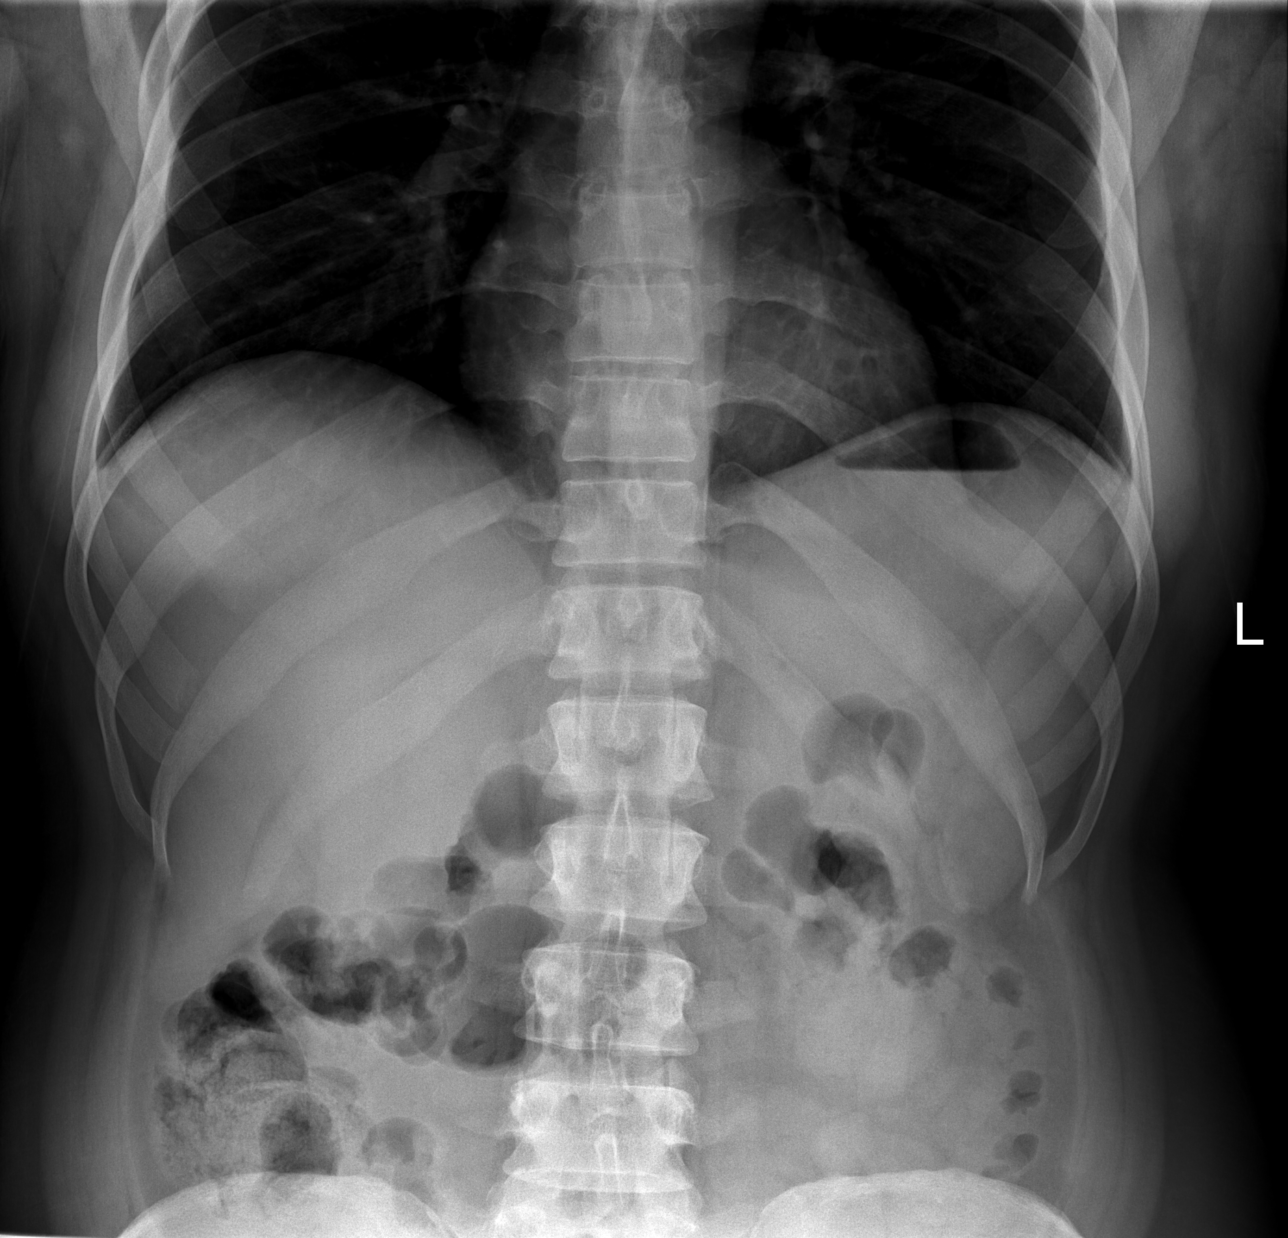

[t abdomen supine]
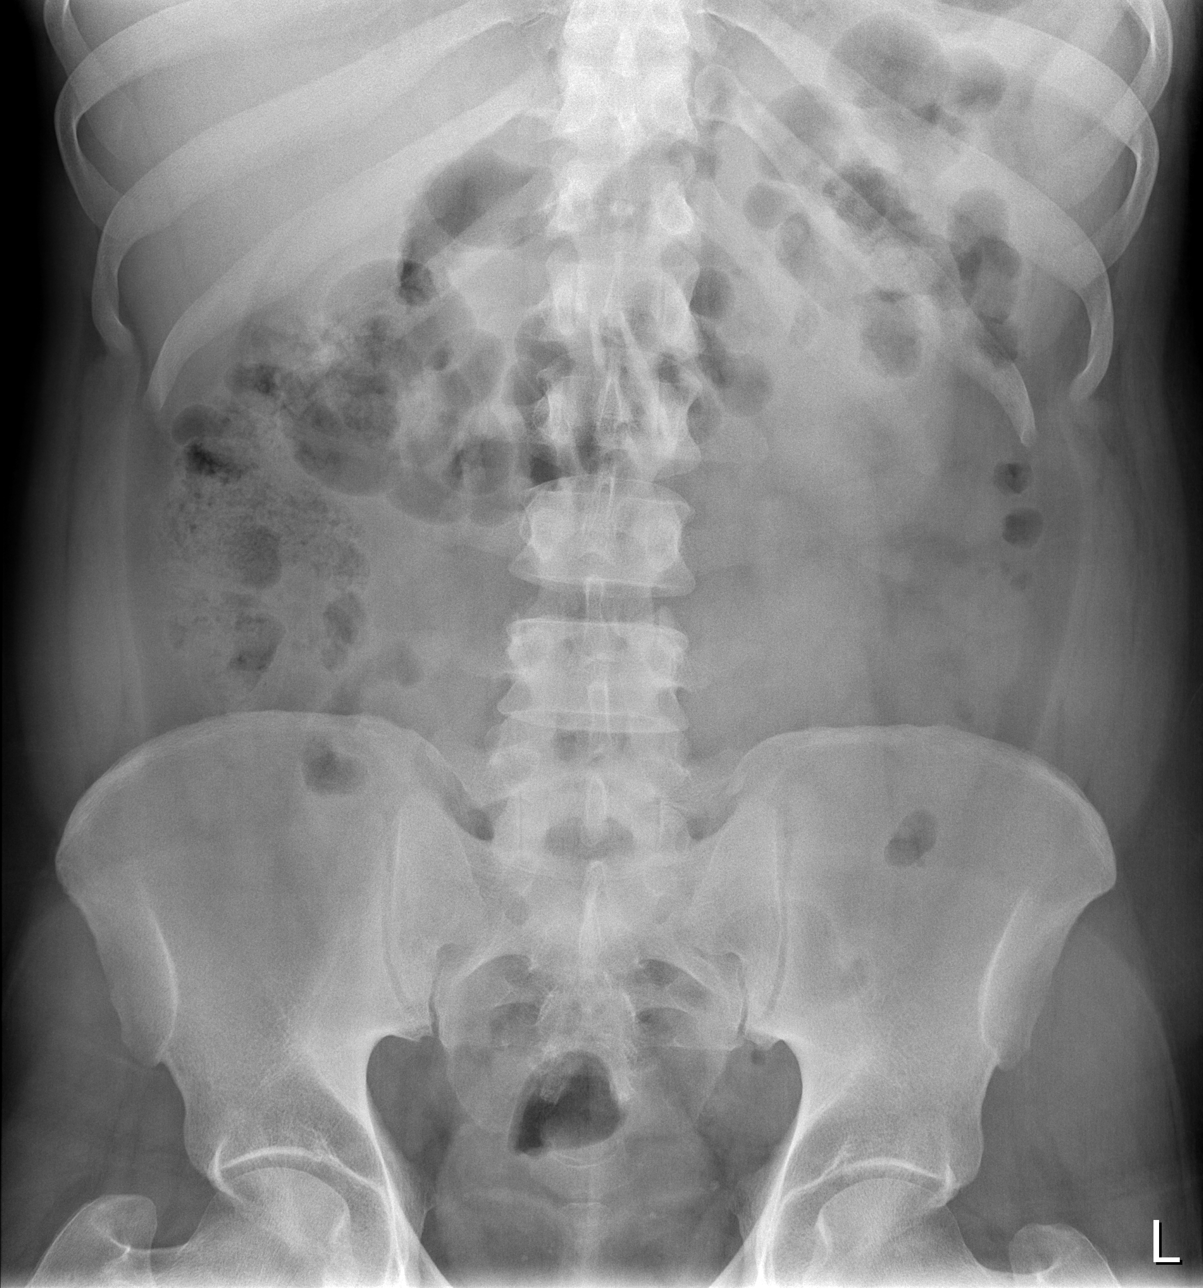

[3 of 3 positions shown; findings below may reference images not displayed]

FINDINGS: There is no evidence of dilated bowel loops or free intraperitoneal
air. No radiopaque calculi or other significant radiographic
abnormality is seen. Heart size and mediastinal contours are within
normal limits. Both lungs are clear.
IMPRESSION: Negative abdominal radiographs.  No acute cardiopulmonary disease.

## 2015-08-23 IMAGING — RF DG CHOLANGIOGRAM OPERATIVE
1 series · 4 of 4 positions shown · non-contrast
Comparison: Abdominal ultrasound - 05/17/2014; CT abdomen pelvis
-08/08/2013

CLINICAL DATA: Laparoscopic cholecystectomy

EXAM:
INTRAOPERATIVE CHOLANGIOGRAM
FLUOROSCOPY TIME:  5 seconds

[Series 1: run · 4 of 23 frames shown]
[frame 4/23]
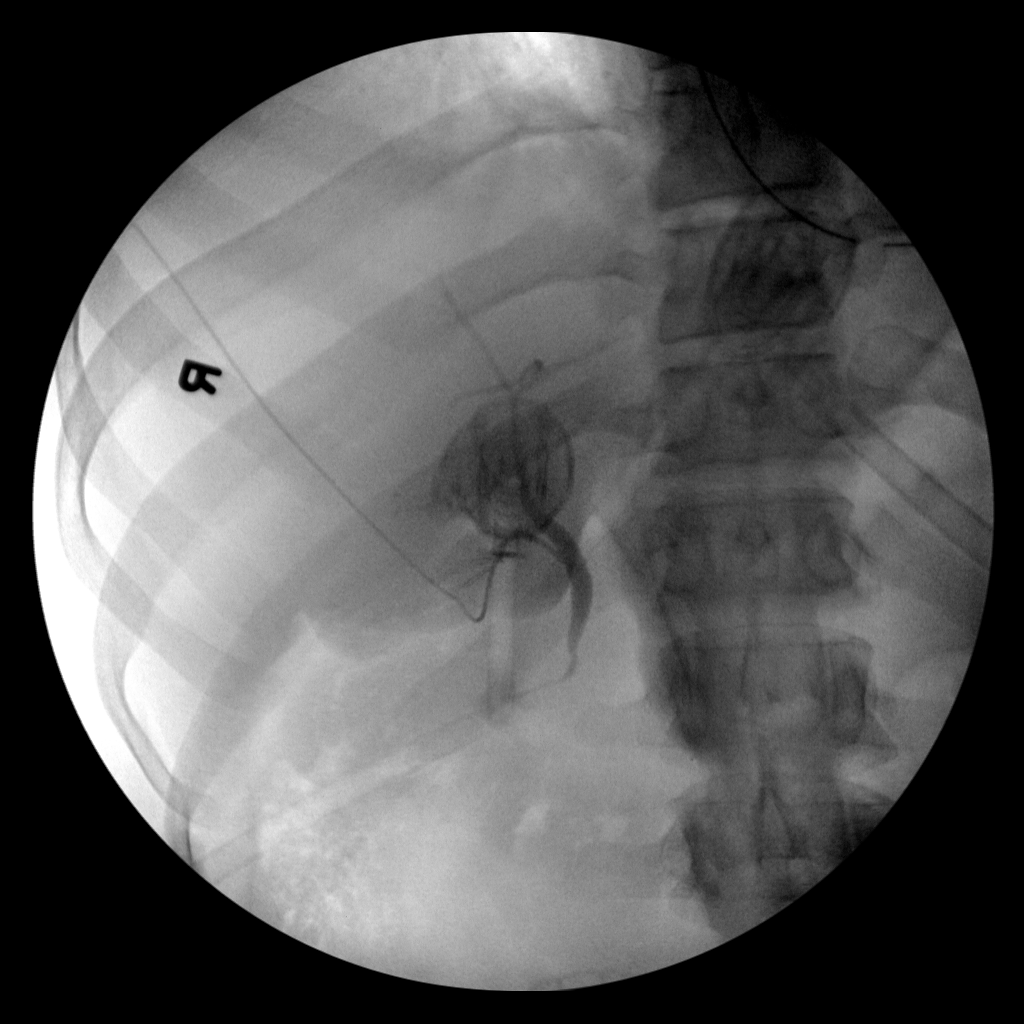
[frame 12/23]
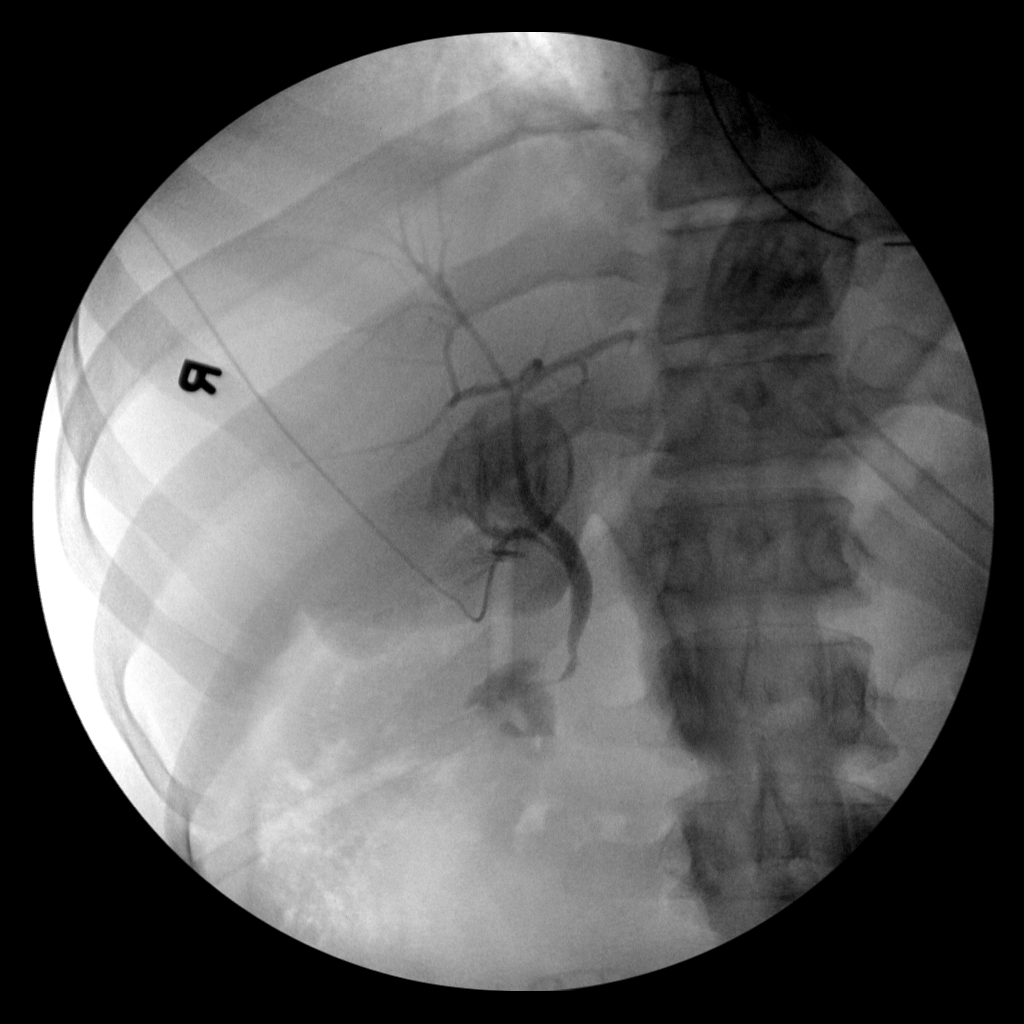
[frame 20/23]
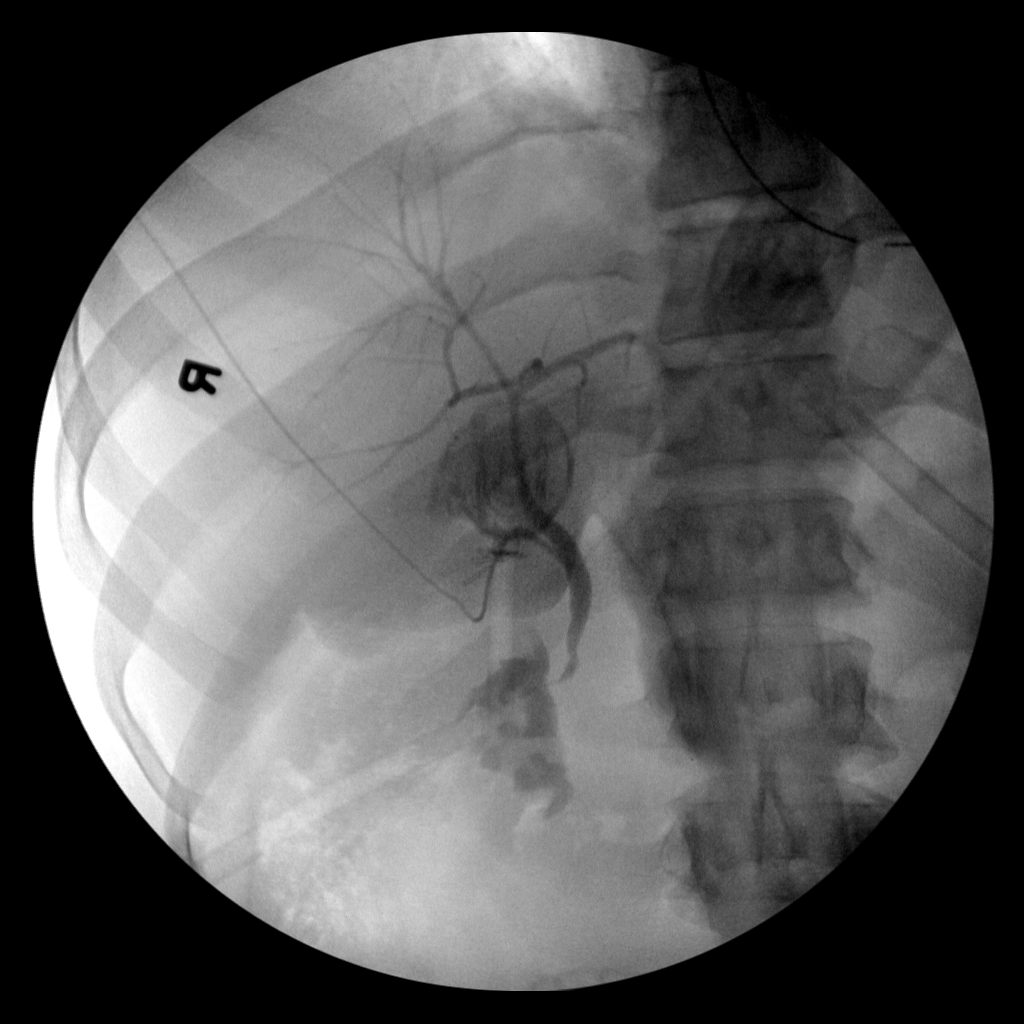
[frame 23/23]
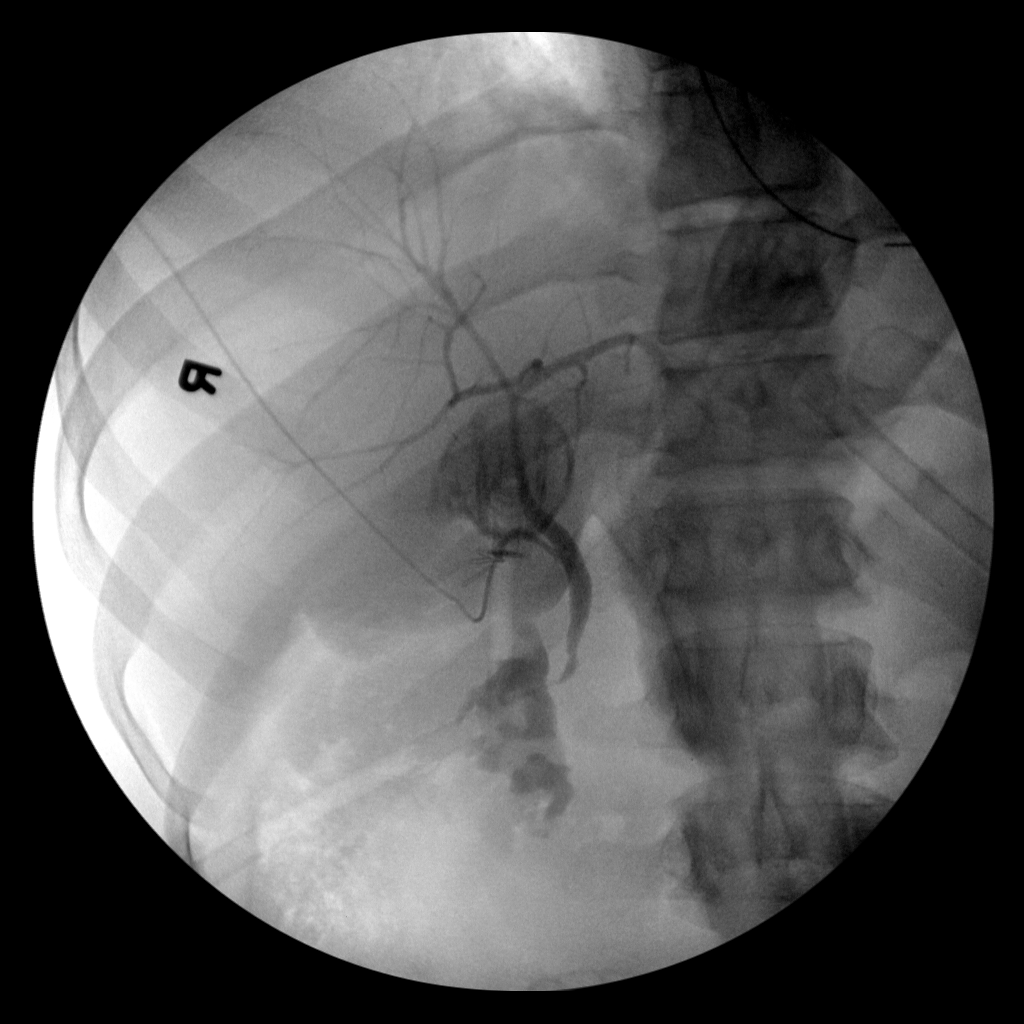

[4 of 4 positions shown; findings below may reference images not displayed]

FINDINGS: Intraoperative angiographic images of the right upper abdominal
quadrant during laparoscopic cholecystectomy are provided for
review.

Surgical clips overlie the expected location of the gallbladder
fossa.

Contrast injection demonstrates selective cannulation of the central
aspect of the cystic duct.

There is passage of contrast through the central aspect of the
cystic duct with filling of a non dilated common bile duct. There is
passage of contrast though the CBD and into the descending portion
of the duodenum.

There is minimal reflux of injected contrast into the common hepatic
duct and central aspect of the non dilated intrahepatic biliary
system.

There are no discrete filling defects within the opacified portions
of the biliary system to suggest the presence of
choledocholithiasis.
IMPRESSION: No evidence of choledocholithiasis.

## 2015-12-19 ENCOUNTER — Ambulatory Visit (INDEPENDENT_AMBULATORY_CARE_PROVIDER_SITE_OTHER): Payer: Medicaid Other | Admitting: Family Medicine

## 2015-12-19 ENCOUNTER — Ambulatory Visit
Admission: RE | Admit: 2015-12-19 | Discharge: 2015-12-19 | Disposition: A | Discharge status: Home / Self Care | Payer: Medicaid Other | Source: Ambulatory Visit | Attending: Family Medicine | Admitting: Family Medicine

## 2015-12-19 VITALS — BP 129/84 | HR 89 | Temp 98.1°F | Wt 260.0 lb

## 2015-12-19 DIAGNOSIS — M25532 Pain in left wrist: Secondary | ICD-10-CM

## 2015-12-19 MED ORDER — NAPROXEN 500 MG PO TABS: 500.0000 mg | ORAL_TABLET | Freq: Two times a day (BID) | ORAL | Status: AC

## 2015-12-19 NOTE — Progress Notes (Signed)
   Subjective:   Edward Lane is a 40 y.o. male with a history of obesity, low back pain, cholecystitis here for same day appointment for "wrist issues "  Wrist pain - left wrist - manufacturing and shipping/receiving at work - reports that he has a lot of aches and pains day to day - a lot of repetitive movements at work - pain worse this AM- woke him from sleep - hasnt taken any medications - first noticed slight pain yesterday during work - worst pain with supination - thought it was slightly swollen - nothing makes it better except not using - cant remember having wrist pain like this before  Review of Systems:  Per HPI. All other systems reviewed and are negative.   PMH, PSH, Medications, Allergies, and FmHx reviewed and updated in EMR.  Social History: never smoker  Objective:  BP 129/84 mmHg  Pulse 89  Temp(Src) 98.1 F (36.7 C) (Oral)  Wt 260 lb (117.935 kg)  Gen:  40 y.o. male in NAD HEENT: NCAT, MMM, anicteric sclerae CV: RRR, no MRG Resp: Non-labored, CTAB, no wheezes noted Ext: WWP, no edema MSK: L wrist: ROM limited 2/2 pain, pain with wrist extension,  Supination, inversion. Strength intact, sensation intact , pulses intact. Tenderness to palpation over ulnar head. Neuro: Alert and oriented, speech normal     Assessment & Plan:     Edward Lane is a 40 y.o. male here for L wrist pain  Left wrist pain  Patient with no known trauma, but multiple repetitive motions and lifting at work  Will x-ray left wrist given tenderness to palpation over ulnar head Likely ligamental strain though Treat with NSAIDs -  Naproxen twice daily  Also advised patient to buy supportive wrist brace and where it during the day Also given work note for no use of left hand and wrist for the next 7 days or until cleared by Dr. Patient follow-up in one week to ensure improvement     Erasmo Downer, MD MPH PGY-2,  [facility] 12/19/2015  3:10 PM

## 2015-12-19 NOTE — Assessment & Plan Note (Signed)
Patient with no known trauma, but multiple repetitive motions and lifting at work  Will x-ray left wrist given tenderness to palpation over ulnar head Likely ligamental strain though Treat with NSAIDs -  Naproxen twice daily  Also advised patient to buy supportive wrist brace and where it during the day Also given work note for no use of left hand and wrist for the next 7 days or until cleared by Dr. Patient follow-up in one week to ensure improvement

## 2015-12-19 NOTE — Patient Instructions (Signed)
Nice to meet you today. We will get x-rays of your wrist. Someone will call you about these results when they're available. Please start taking naproxen twice daily regularly for the pain. This will also help with inflammation. Please buy a wrist brace at the pharmacy and wear this whenever you are at work or using her hand. I'm giving you a work note for you to not use your left wrist for the next week. Please make a follow-up appointment with your primary care doctor in about a week to make sure this is getting better.  Take care,  Dr. Leonard Schwartz

## 2015-12-20 ENCOUNTER — Telehealth: Payer: Self-pay | Admitting: Family Medicine

## 2015-12-20 NOTE — Telephone Encounter (Signed)
Pt is returning Dr. Penelope Coop call. He was informed about the X-rays and wanted to know if he should wrap this or get a splint? Please let him know. jw

## 2015-12-20 NOTE — Telephone Encounter (Signed)
Called patient to discuss results of wrist xray.  No answer, VM left asking patient to call back to clinic.  No fracture noted on XRay.  Plan as discussed during visit.  Please relay above information if patient returns call.  Erasmo Downer, MD, MPH PGY-2,  [facility] 12/20/2015 1:40 PM

## 2015-12-21 NOTE — Telephone Encounter (Signed)
Forwarding to Dr. Beryle Flock.

## 2016-08-09 ENCOUNTER — Other Ambulatory Visit: Payer: Self-pay | Admitting: Occupational Medicine

## 2016-08-09 ENCOUNTER — Ambulatory Visit: Payer: Self-pay

## 2016-08-09 DIAGNOSIS — Z0000 Encounter for general adult medical examination without abnormal findings: Secondary | ICD-10-CM

## 2016-10-15 ENCOUNTER — Encounter: Payer: Self-pay | Admitting: Internal Medicine

## 2016-10-15 ENCOUNTER — Ambulatory Visit (INDEPENDENT_AMBULATORY_CARE_PROVIDER_SITE_OTHER): Payer: Managed Care, Other (non HMO) | Admitting: Internal Medicine

## 2016-10-15 DIAGNOSIS — H5789 Other specified disorders of eye and adnexa: Secondary | ICD-10-CM | POA: Insufficient documentation

## 2016-10-15 DIAGNOSIS — H578 Other specified disorders of eye and adnexa: Secondary | ICD-10-CM

## 2016-10-15 NOTE — Patient Instructions (Signed)
You can try over the counter red eye drops to treat the redness.   If you start having vision changes, drainage from the eye, gritty/sandpaper feeling in the eye, or pain around the eye please let us know.

## 2016-10-15 NOTE — Progress Notes (Signed)
   Subjective:    Edward Lane - 40 y.o. male MRN [id number]  Date of birth: 07/10/1976  HPI  Edward Lane is here for SDA for eye redness.  Eye Redness: Started 4 days ago when he woke up with gritty/sandpaper sensation in his right eye. Said it felt like he had foreign body in the right eye. Subsequently he noticed that the right eye was red. The sensation has since resolved 3 days ago but the eye redness has persisted. He denies eye drainage, pruritis or pain. He denies changes in his vision. He does not wear contact lenses. Says its possible he scratched his eye at night because he "is a wild sleeper" but denies known trauma to the eye. He does have a history of seasonal allergies but states he has been asymptomatic and is not currently needing medical therapy for allergies. 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 He wears protective eye wear while working. Denies contacts with any eye infections.   -  reports that he has never smoked. He has never used smokeless tobacco. - Review of Systems: Per HPI. - Past Medical History: Patient Active Problem List   Diagnosis Date Noted  . Eye irritation 10/15/2016  . Left wrist pain 12/19/2015  . Screening cholesterol level 06/10/2015  . Puncture wound of left foot 06/10/2015  . Encounter for screening for HIV 06/10/2015  . Acute cholecystitis 05/17/2014  . Contact dermatitis 03/26/2014  . Duodenal ulcer 08/14/2013  . Cholelithiasis 08/08/2013  . Epigastric abdominal pain 08/08/2013  . Nausea & vomiting 08/08/2013  . Low back pain 04/10/2013  . Headache(784.0) 03/27/2013  . Neck pain on left side 03/04/2013  . Allergic rhinitis due to pollen 01/25/2011  . Environmental allergies 01/25/2011  . Obesity, unspecified 06/23/2010   - Medications: reviewed and updated   Objective:   Physical Exam BP 118/78   Pulse 73   Temp 97.8 F (36.6 C) (Oral)   Ht 6' (1.829 m)   Wt 257 lb 12.8 oz (116.9 kg)   SpO2 99%   BMI 34.96  kg/m  Gen: NAD, alert, cooperative with exam, well-appearing HEENT: conjunctival injection bilaterally R >L, PEERL, EOMI, no TTP to orbits bilaterally, no foreign bodies in right eye visualized   Vision: 20/20 bilaterally     Assessment & Plan:   Eye irritation Suspect mild corneal abrasion from scratch or foreign body which has since been removed. Given that he is asymptomatic aside from eye redness, patient declined fluorescein stain. Reassured that vision in intact. There is actually eye redness bilaterally so may be component of allergic conjunctivitis but patient states he is not symptomatic so elect to treat with OTC red eye drops. Doubt bacterial conjunctivitis given lack of drainage on exam or history. Return precautions discussed.     Marcy Sirenatherine Bellarose Burtt, D.O. 10/15/2016, 10:28 AM PGY-2, [facility]

## 2016-10-15 NOTE — Assessment & Plan Note (Addendum)
Suspect mild corneal abrasion from scratch or foreign body which has since been removed. Given that he is asymptomatic aside from eye redness, patient declined fluorescein stain. Reassured that vision in intact. There is actually eye redness bilaterally so may be component of allergic conjunctivitis but patient states he is not symptomatic so elect to treat with OTC red eye drops. Doubt bacterial conjunctivitis given lack of drainage on exam or history. Return precautions discussed.

## 2019-03-27 ENCOUNTER — Other Ambulatory Visit: Payer: Self-pay

## 2019-03-27 ENCOUNTER — Encounter: Payer: Self-pay | Admitting: Family Medicine

## 2019-03-27 ENCOUNTER — Ambulatory Visit (INDEPENDENT_AMBULATORY_CARE_PROVIDER_SITE_OTHER): Payer: 59 | Admitting: Family Medicine

## 2019-03-27 VITALS — BP 110/70 | HR 72

## 2019-03-27 DIAGNOSIS — S29012A Strain of muscle and tendon of back wall of thorax, initial encounter: Secondary | ICD-10-CM | POA: Insufficient documentation

## 2019-03-27 DIAGNOSIS — Z566 Other physical and mental strain related to work: Secondary | ICD-10-CM | POA: Diagnosis not present

## 2019-03-27 NOTE — Assessment & Plan Note (Signed)
Counseled patient to apply heat and ice and to stretch his back and neck when able.  He may find relief from seeing a chiropractor as well, especially since he already benefits from a chiropractor.  Advised patient to let me know if his symptoms do not improve with time.  Also advised him to take Tylenol rather than NSAIDs for pain relief since he has a history of a duodenal ulcer.

## 2019-03-27 NOTE — Progress Notes (Signed)
Subjective:    Edward DrownKelvin Cromartie - 43 y.o. male MRN [id number]  Date of birth: 12/24/1975  CC:  Edward Lane is here for follow up of a forklift accident and would like to discuss his stress from his work environment.  HPI: Headache from forklift accident Patient reports that he was moving items using a forklift and did not see a pillar and ran into it with the forklift.  He did not hit his head, but his body did jolt from side to side when the forklift hit the pillar.  He experienced a headache about 45 minutes later.  He tried tylenol then ibuprofen, neither of which helped.  He filed an incident report with his work.  He tried 1200 mg ibuprofen after this and woke up with a worse headache in the middle of the night.  He called out of his work Wednesday and he spoke with the site nurse and told them that he thought it was a tension headache from tensing up the muscles of his lower back when he was in the collision.  He will see his company's physician as a follow up after seeing us.  He plans to see a chiropractor later today.  He denies nausea/vomiting or vision changes associated with his headache.  Stress due to work environment Patient also reports that he is under a high amount of stress due to his work environment.  He says that this is affecting his concentration and thinks that it may have been a factor in his collision.  He also thinks that it has harmed his relationship with others and his eating habits and exercise habits have worsened.  He would like to know what I would advise for working through the stress.  Health Maintenance:  There are no preventive care reminders to display for this patient.  -  reports that he has never smoked. He has never used smokeless tobacco. - Review of Systems: Per HPI. - Past Medical History: Patient Active Problem List   Diagnosis Date Noted  . Muscle strain of upper back 03/27/2019  . Stress at work 03/27/2019  . Eye irritation 10/15/2016   . Left wrist pain 12/19/2015  . Screening cholesterol level 06/10/2015  . Puncture wound of left foot 06/10/2015  . Encounter for screening for HIV 06/10/2015  . Acute cholecystitis 05/17/2014  . Contact dermatitis 03/26/2014  . Duodenal ulcer 08/14/2013  . Cholelithiasis 08/08/2013  . Epigastric abdominal pain 08/08/2013  . Nausea & vomiting 08/08/2013  . Low back pain 04/10/2013  . Headache(784.0) 03/27/2013  . Neck pain on left side 03/04/2013  . Allergic rhinitis due to pollen 01/25/2011  . Environmental allergies 01/25/2011  . Obesity, unspecified 06/23/2010   - Medications: reviewed and updated   Objective:   Physical Exam BP 110/70   Pulse 72   SpO2 97%  Gen: NAD, alert, cooperative with exam, well-appearing CV: RRR, good S1/S2, no murmur, no edema Resp: CTABL, no wheezes, non-labored Skin: no rashes, normal turgor  Musculoskeletal: Patient has impaired active extension of his neck, but has otherwise normal range of motion.  He is nontender to palpation and has no asymmetry. Neurological: No gross deformities, finger-to-nose normal, extraocular movements are normal Psych: good insight, alert and oriented        Assessment & Plan:   Muscle strain of upper back Counseled patient to apply heat and ice and to stretch his back and neck when able.  He may find relief from seeing a chiropractor as well, especially  since he already benefits from a chiropractor.  Advised patient to let me know if his symptoms do not improve with time.  Also advised him to take Tylenol rather than NSAIDs for pain relief since he has a history of a duodenal ulcer.  Stress at work Patient is experiencing distress from a difficult work environment.  Encouraged patient to make appointments to speak either with me, our social worker Gavin Pound, or I can refer him to psychology.  He is open to discussing his stressors with someone, and I believe he would benefit from this as well.  He will look into  making an appointment with me or with Gavin Pound and we will hold off the referral for now.    Lezlie Octave, M.D. 03/27/2019, 5:25 PM PGY-2, [facility]

## 2019-03-27 NOTE — Assessment & Plan Note (Signed)
Patient is experiencing distress from a difficult work environment.  Encouraged patient to make appointments to speak either with me, our social worker Gavin Pound, or I can refer him to psychology.  He is open to discussing his stressors with someone, and I believe he would benefit from this as well.  He will look into making an appointment with me or with Gavin Pound and we will hold off the referral for now.

## 2019-03-27 NOTE — Patient Instructions (Addendum)
It was nice meeting you today Mr. Po!  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  This should improve with time, but stretching, applying heat and ice, and going to your chiropractor will likely help you improve faster.  Please do not take high quantities of ibuprofen or Aleve due to your history of a duodenal ulcer, but Tylenol is safe.  Please let us know if you do not improve with time.  I have sent a referral for [facility].  Please make an appointment for your annual physical sometime this year.  If you have any questions or concerns, please feel free to call the clinic.   Be well,  Dr. Frances Furbish

## 2019-04-16 ENCOUNTER — Ambulatory Visit: Payer: 59 | Admitting: Family Medicine

## 2019-04-21 ENCOUNTER — Ambulatory Visit: Payer: 59 | Admitting: Family Medicine

## 2019-07-16 ENCOUNTER — Telehealth: Payer: Self-pay | Admitting: Family Medicine

## 2019-07-16 NOTE — Telephone Encounter (Signed)
Patient needs to know who the 'friend/acquaintance' is that you mentioned at his last visit that he could see?  He didn't say what the issue was but that you would know.  Can you call him with this information, at [phone].

## 2019-07-17 NOTE — Telephone Encounter (Signed)
I left a VM with the patient explaining that unfortunately, we no longer have in-house counseling, but I told him that I will be happy to place a referral to psychology, and I directed him to [email or web address] to find a psychologist who would fit his needs.  I am also happy to speak with him over the phone if he needs to talk, and I have an appointment with him on 10/1.  I asked him to call and let me know if he would like for me to send in the referral.

## 2019-08-06 ENCOUNTER — Ambulatory Visit: Payer: 59 | Admitting: Family Medicine

## 2019-08-17 ENCOUNTER — Ambulatory Visit (INDEPENDENT_AMBULATORY_CARE_PROVIDER_SITE_OTHER): Payer: Medicaid Other | Admitting: Family Medicine

## 2019-08-17 ENCOUNTER — Encounter: Payer: Self-pay | Admitting: Family Medicine

## 2019-08-17 ENCOUNTER — Other Ambulatory Visit: Payer: Self-pay

## 2019-08-17 VITALS — BP 120/64 | HR 83 | Ht 72.05 in | Wt 257.2 lb

## 2019-08-17 DIAGNOSIS — F430 Acute stress reaction: Secondary | ICD-10-CM | POA: Diagnosis not present

## 2019-08-17 DIAGNOSIS — F411 Generalized anxiety disorder: Secondary | ICD-10-CM | POA: Insufficient documentation

## 2019-08-17 MED ORDER — SERTRALINE HCL 50 MG PO TABS: 50.0000 mg | ORAL_TABLET | Freq: Every day | ORAL | 3 refills | Status: DC

## 2019-08-17 NOTE — Progress Notes (Signed)
Subjective:    Edward Lane - 43 y.o. male MRN [id number]  Date of birth: 12/03/1975  CC:  Edward Lane is here for follow up of his anxiety  HPI: Patient reports that since he saw me last, he has reported that the discrimination that he faced at work to [employer] office.  Thankfully, they have taken action and he is no longer working at that site.  He hopes to continue working for the company but to move to another site, but this is not yet in place.  In the meantime, he is working for a Materials engineer.  He has also hired a Clinical research associate to continue addressing his concerns over workplace discrimination.  His company has encouraged him to fill out FMLA paperwork because he has been and able to perform at the best of his ability due to the anxiety he has experienced as a result of the racial discrimination at his workplace.  He describes being the only black man at his work place and watching a video with about 81 Caucasian men that was explicitly racist.  He says that no one his workplace objected to this video or apologized for the use of the N word in the video.  He says that he experienced regular bullying and intimidation at his workplace as well.  His anxiety resulting from this discrimination has impacted his eating habits and relationship with his wife and family.  He has been receiving counseling from his pastor, which has been helpful.  He says that he is planning to see a psychologist in the community as well.  Health Maintenance:  There are no preventive care reminders to display for this patient.  -  reports that he has never smoked. He has never used smokeless tobacco. - Review of Systems: Per HPI. - Past Medical History: Patient Active Problem List   Diagnosis Date Noted  . Anxiety in acute stress reaction 08/17/2019  . Muscle strain of upper back 03/27/2019  . Stress at work 03/27/2019  . Eye irritation 10/15/2016  . Left wrist pain 12/19/2015  . Screening cholesterol level  06/10/2015  . Puncture wound of left foot 06/10/2015  . Encounter for screening for HIV 06/10/2015  . Acute cholecystitis 05/17/2014  . Contact dermatitis 03/26/2014  . Duodenal ulcer 08/14/2013  . Cholelithiasis 08/08/2013  . Epigastric abdominal pain 08/08/2013  . Nausea & vomiting 08/08/2013  . Low back pain 04/10/2013  . Headache(784.0) 03/27/2013  . Neck pain on left side 03/04/2013  . Allergic rhinitis due to pollen 01/25/2011  . Environmental allergies 01/25/2011  . Obesity, unspecified 06/23/2010   - Medications: reviewed and updated   Objective:   Physical Exam BP 120/64   Pulse 83   Ht 6' 0.05" (1.83 m)   Wt 257 lb 4 oz (116.7 kg)   SpO2 96%   BMI 34.84 kg/m  Gen: NAD, alert, cooperative with exam, well-appearing Psych: good insight, alert and oriented, mildly anxious affect  GAD 7 : Generalized Anxiety Score 08/17/2019 08/17/2019  Nervous, Anxious, on Edge 2 2  Control/stop worrying 2 2  Worry too much - different things 2 2  Trouble relaxing 2 2  Restless 1 1  Easily annoyed or irritable 1 1  Afraid - awful might happen 2 2  Total GAD 7 Score 12 12  Anxiety Difficulty Very difficult -   Depression screen PHQ 2/9 08/17/2019  Decreased Interest 1  Down, Depressed, Hopeless 1  PHQ - 2 Score 2  Altered sleeping 0  Tired, decreased energy 1  Change in appetite 2  Feeling bad or failure about yourself  2  Trouble concentrating 1  Moving slowly or fidgety/restless 1  Suicidal thoughts 0  PHQ-9 Score 9  Difficult doing work/chores Very difficult      Assessment & Plan:   Anxiety in acute stress reaction Mr. [name] continues to experience anxiety related to the workplace discrimination he has faced.  His GAD 7 is significant for moderate anxiety, and PHQ 9 is significant for mild depression.  We decided to start Zoloft 50 mg once daily today.  We will follow-up in 1 month.  Patient was advised that this medication may take several weeks to reach its  full effect and was counseled on possible side effects.  Also encouraged him to seek therapy as well as meeting with me.  Referral to psychology placed today.  Will fill out FMLA form.    Maia Breslow, M.D. 08/17/2019, 11:00 AM PGY-3, [place]

## 2019-08-17 NOTE — Patient Instructions (Signed)
It was nice seeing you today Mr. Tangney!  I am glad that things are going better for you, and I hope that everything settles down more in the near future.  I will work on Armed forces logistics/support/administrative officer and we will give you a call when it is done.  We are starting Zoloft 50 mg once daily today.  Please give it at least 1 month to have its full effect.  We can always increase the dose as well.  I would like to see you back in about 1 month to see how you are doing with this medication.  If you have any questions or concerns, please feel free to call the clinic.   Be well,  Dr. Shan Levans

## 2019-08-17 NOTE — Assessment & Plan Note (Signed)
Mr. Crossley continues to experience anxiety related to the workplace discrimination he has faced.  His GAD 7 is significant for moderate anxiety, and PHQ 9 is significant for mild depression.  We decided to start Zoloft 50 mg once daily today.  We will follow-up in 1 month.  Patient was advised that this medication may take several weeks to reach its full effect and was counseled on possible side effects.  Also encouraged him to seek therapy as well as meeting with me.  Referral to psychology placed today.  Will fill out FMLA form.

## 2019-09-22 ENCOUNTER — Ambulatory Visit: Payer: Medicaid Other | Admitting: Family Medicine

## 2019-10-07 ENCOUNTER — Ambulatory Visit: Payer: Medicaid Other | Admitting: Family Medicine

## 2019-11-03 ENCOUNTER — Ambulatory Visit: Payer: Medicaid Other | Admitting: Family Medicine

## 2019-11-16 ENCOUNTER — Ambulatory Visit (INDEPENDENT_AMBULATORY_CARE_PROVIDER_SITE_OTHER): Payer: Medicaid Other | Admitting: Family Medicine

## 2019-11-16 ENCOUNTER — Other Ambulatory Visit: Payer: Self-pay

## 2019-11-16 VITALS — BP 122/80 | HR 75 | Ht 72.0 in | Wt 271.1 lb

## 2019-11-16 DIAGNOSIS — G44009 Cluster headache syndrome, unspecified, not intractable: Secondary | ICD-10-CM | POA: Diagnosis present

## 2019-11-16 MED ORDER — SERTRALINE HCL 50 MG PO TABS: 50.0000 mg | ORAL_TABLET | Freq: Every day | ORAL | 0 refills | Status: DC

## 2019-11-16 MED ORDER — SUMATRIPTAN SUCCINATE 25 MG PO TABS: 25.0000 mg | ORAL_TABLET | ORAL | 0 refills | Status: AC | PRN

## 2019-11-16 NOTE — Patient Instructions (Signed)
It was great meeting you today!  I am sorry that you have been having this bad headache with eye pain.  Think that you that the bill for something called a cluster headache.  We will try a medication called sumatriptan typically works for this.  We will also get a routine head CT to rule out any other causes that her presentation is a little bit abnormal.

## 2019-11-19 ENCOUNTER — Encounter: Payer: Self-pay | Admitting: Family Medicine

## 2019-11-19 DIAGNOSIS — G44009 Cluster headache syndrome, unspecified, not intractable: Secondary | ICD-10-CM | POA: Insufficient documentation

## 2019-11-19 NOTE — Assessment & Plan Note (Signed)
Given the rapid onset and rapid resolution, multiple times overnight, with accompanying eye involvement patient symptoms are consistent with a cluster headache.  It had resolved by the time he reached clinic.  Can try triptan to see if this is helpful if it happens again.  Will get CT head without contrast to rule out other etiologies.  Similarly we will get ESR to evaluate for temporal arteritis, although this is less likely given the equal temporal artery pulses.  Patient can also continue to take naproxen 500 mg twice daily if the pain persists. -Imitrex 25 mg every 2 hours as needed during attack -Naproxen 500 mg twice daily-head CT without contrast -ESR -we will follow up from there

## 2019-11-19 NOTE — Progress Notes (Signed)
   HPI 44 year old male who presents for left frontal pain/pulsation, accompanying eye lacrimation.  He states that his symptoms first started on the night prior to this visit.  The pain is described as a very sharp/intense pain that is present for a few minutes and then resolves.  The pain comes in waves.  It has been present several times since it for started.  States he was unable to sleep last night due to to the pain.  He has never experienced a thing like this before.  No accompanying photophobia, nausea, vomiting, or any other symptoms aside from the pulsatile pain and eye lacrimation.  CC: Head pain and eye watering   ROS:   Review of Systems See HPI for ROS.   CC, SH/smoking status, and VS noted  Objective: BP 122/80   Pulse 75   Ht 6' (1.829 m)   Wt 271 lb 2 oz (123 kg)   SpO2 98%   BMI 36.77 kg/m  Gen: Very pleasant 44 year old African-American male HEENT: PERRLA, EOMI, normal appearing disks on funduscopy.  No decreased pulse in temporal artery distribution as compared to right side.  Mild tenderness to palpation remainder distribution of frontal lobe.  No cerumen impaction, no bulging tympanic membrane. CV: Regular rate and rhythm, no M/R/G. Resp: CTAB, no wheezes, non-labored Neuro: No gross deficits.  CN II through XII intact.  Equal vision right, left on Snellen chart.   Assessment and plan:  Cluster headache Given the rapid onset and rapid resolution, multiple times overnight, with accompanying eye involvement patient symptoms are consistent with a cluster headache.  It had resolved by the time he reached clinic.  Can try triptan to see if this is helpful if it happens again.  Will get CT head without contrast to rule out other etiologies.  Similarly we will get ESR to evaluate for temporal arteritis, although this is less likely given the equal temporal artery pulses.  Patient can also continue to take naproxen 500 mg twice daily if the pain persists. -Imitrex 25 mg  every 2 hours as needed during attack -Naproxen 500 mg twice daily-head CT without contrast -ESR -we will follow up from there   Orders Placed This Encounter  Procedures  . CT Head Wo Contrast    Epic order [id number]-270lbs/prev ct head 2014 in epic/no needs/ins-medicaid/clc/tashria No to Covid querstions    Standing Status:   Future    Standing Expiration Date:   02/13/2021    Order Specific Question:   Preferred imaging location?    Answer:   [id number]    Order Specific Question:   Radiology Contrast Protocol - do NOT remove file path    Answer:   [email or web address]  . Sedimentation Rate    Meds ordered this encounter  Medications  . SUMAtriptan (IMITREX) 25 MG tablet    Sig: Take 1 tablet (25 mg total) by mouth every 2 (two) hours as needed for migraine. May repeat in 2 hours if headache persists or recurs.    Dispense:  10 tablet    Refill:  0  . sertraline (ZOLOFT) 50 MG tablet    Sig: Take 1 tablet (50 mg total) by mouth daily.    Dispense:  90 tablet    Refill:  0     Guadalupe Dawn MD PGY-3 Family Medicine Resident  11/19/2019 8:57 AM

## 2019-11-20 ENCOUNTER — Inpatient Hospital Stay: Admission: RE | Admit: 2019-11-20 | Payer: Medicaid Other | Source: Ambulatory Visit

## 2019-11-20 ENCOUNTER — Other Ambulatory Visit: Payer: Medicaid Other

## 2019-11-30 ENCOUNTER — Ambulatory Visit: Payer: Medicaid Other | Admitting: Family Medicine

## 2019-12-09 ENCOUNTER — Other Ambulatory Visit: Payer: Self-pay

## 2019-12-09 ENCOUNTER — Ambulatory Visit: Payer: Medicaid Other | Admitting: Family Medicine

## 2019-12-14 ENCOUNTER — Ambulatory Visit: Payer: Medicaid Other | Admitting: Family Medicine

## 2020-02-19 ENCOUNTER — Other Ambulatory Visit: Payer: Self-pay | Admitting: Family Medicine

## 2020-07-01 DIAGNOSIS — U071 COVID-19: Secondary | ICD-10-CM | POA: Insufficient documentation

## 2020-07-12 ENCOUNTER — Other Ambulatory Visit: Payer: Self-pay

## 2020-07-12 ENCOUNTER — Ambulatory Visit (INDEPENDENT_AMBULATORY_CARE_PROVIDER_SITE_OTHER): Payer: No Typology Code available for payment source | Admitting: Family Medicine

## 2020-07-12 ENCOUNTER — Encounter: Payer: Self-pay | Admitting: Family Medicine

## 2020-07-12 DIAGNOSIS — Z8616 Personal history of COVID-19: Secondary | ICD-10-CM | POA: Diagnosis not present

## 2020-07-12 MED ORDER — BENZONATATE 100 MG PO CAPS: 100.0000 mg | ORAL_CAPSULE | Freq: Two times a day (BID) | ORAL | 0 refills | Status: AC | PRN

## 2020-07-12 NOTE — Patient Instructions (Signed)
Please return to our clinic if symptoms worsen or if you do not feel improved when it is time for you to return to work.   10 Things You Can Do to Manage Your COVID-19 Symptoms at Home If you have possible or confirmed COVID-19: 1. Stay home from work and school. And stay away from other public places. If you must go out, avoid using any kind of public transportation, ridesharing, or taxis. 2. Monitor your symptoms carefully. If your symptoms get worse, call your healthcare provider immediately. 3. Get rest and stay hydrated. 4. If you have a medical appointment, call the healthcare provider ahead of time and tell them that you have or may have COVID-19. 5. For medical emergencies, call 911 and notify the dispatch personnel that you have or may have COVID-19. 6. Cover your cough and sneezes with a tissue or use the inside of your elbow. 7. Wash your hands often with soap and water for at least 20 seconds or clean your hands with an alcohol-based hand sanitizer that contains at least 60% alcohol. 8. As much as possible, stay in a specific room and away from other people in your home. Also, you should use a separate bathroom, if available. If you need to be around other people in or outside of the home, wear a mask. 9. Avoid sharing personal items with other people in your household, like dishes, towels, and bedding. 10. Clean all surfaces that are touched often, like counters, tabletops, and doorknobs. Use household cleaning sprays or wipes according to the label instructions. [email or web address] 05/06/2019 This information is not intended to replace advice given to you by your health care provider. Make sure you discuss any questions you have with your health care provider. Document Revised: 10/08/2019 Document Reviewed: 10/08/2019 Elsevier Patient Education  2020 [employer].

## 2020-07-12 NOTE — Progress Notes (Signed)
° ° °  SUBJECTIVE:   CHIEF COMPLAINT / HPI: cough   Patient reports that he has had a cough two weeks. Dry cough that has improved. Denies hemoptysis. Denies posttussive emesis. Diarrhea has stopped. Has improved GI symptoms.  Reports he overall feels improvement in his symptoms; has been able to maintain fluids. Patient reports losing close to 20lbs since being diagnosed with COVID. He reports that he continues to feel weak and has concern that he will not be able to perform required duties at his physically demanding job. Patient reports that he self isolated for 14 days and is requesting to be out of work for longer period of time.  PERTINENT  PMH / PSH: COVID-19 Infection   OBJECTIVE:   BP 102/72    Pulse 88    SpO2 97%    General: male appearing stated age in no acute distress HEENT: MMM, no oral lesions noted,Neck non-tender without lymphadenopathy Cardio: Normal S1 and S2, no S3 or S4. Rhythm is regular. No murmurs or rubs.  Bilateral radial pulses palpable Pulm: Clear to auscultation bilaterally, no crackles, wheezing, or diminished breath sounds. Normal respiratory effort, stable on RA  Abdomen: Bowel sounds normal. Abdomen soft without tenderness to palpation   ASSESSMENT/PLAN:   History of COVID-19 Patient continues to recover from COVID-19 infection.  -patient given note to return to work on 07/25/20  -follow up PRN  -recommend discussing covid vaccination once fully recovered - prescribed tessalon for persistent cough     Ronnald Ramp, MD [facility]

## 2020-07-14 DIAGNOSIS — Z8616 Personal history of COVID-19: Secondary | ICD-10-CM | POA: Insufficient documentation

## 2020-07-14 NOTE — Assessment & Plan Note (Addendum)
Patient continues to recover from COVID-19 infection.  -patient given note to return to work on 07/25/20  -follow up PRN  -recommend discussing covid vaccination once fully recovered - prescribed tessalon for persistent cough

## 2020-07-20 ENCOUNTER — Telehealth: Payer: Self-pay | Admitting: Family Medicine

## 2020-07-20 ENCOUNTER — Encounter: Payer: Self-pay | Admitting: Family Medicine

## 2020-07-20 NOTE — Telephone Encounter (Signed)
[name] regarding his FMLA paperwork that was delivered to my inbox. We discussed the condition for which the paperwork is needed - out of work for COVID. Will fill out the paperwork today and send to clinic medical records department.   Edward Lane requests that we give him a courtesy call once the complete forms are faxed, so he may call the company to expedite the FMLA process. He is concerned about submitting everything in time to get a paycheck. Reports that he can't afford to miss a paycheck.   Fayette Pho, MD

## 2021-04-21 ENCOUNTER — Other Ambulatory Visit: Payer: Self-pay

## 2021-04-21 ENCOUNTER — Ambulatory Visit (INDEPENDENT_AMBULATORY_CARE_PROVIDER_SITE_OTHER): Payer: No Typology Code available for payment source | Admitting: Family Medicine

## 2021-04-21 DIAGNOSIS — L989 Disorder of the skin and subcutaneous tissue, unspecified: Secondary | ICD-10-CM | POA: Insufficient documentation

## 2021-04-21 NOTE — Patient Instructions (Signed)
It was a pleasure to see you today!  For the bumps on your legs: try using ibuprofen 600 mg (3 pills) twice a day for 4 days. Also use a warm compress or heat pack to see if it decreases in size. If no improvement or it worsens, please return for another evaluation.    Be Well,  Dr. Leary Roca

## 2021-04-21 NOTE — Progress Notes (Signed)
    SUBJECTIVE:   CHIEF COMPLAINT / HPI: knots on right leg  45 yo male patient presents with complaint of "knots" on right lower leg that have been present for about 2-3 months. He did not pay much attention to them as they were not causing any problems. But the other day, he bumped the side of his leg while walking at work and he had intense pain. He noticed the knots after happening to feel them one day. They have not been growing in size and they are not visible on the skin. He has no h/o SLE, sarcoidosis. Denies fevers, chills, runny nose, SOB, CP, n/v/d. No unilateral calf swelling, no long trips or car rides, no h/o DVT.  PERTINENT  PMH / PSH: none  OBJECTIVE:   BP 119/79   Pulse 76   Ht 6' (1.829 m)   Wt 267 lb 2 oz (121.2 kg)   SpO2 99%   BMI 36.23 kg/m   Nursing note and vitals reviewed GEN: age-appropriate AAM, resting comfortably in chair, NAD, obese Neuro: Alert and at baseline Lower extremities: 2cm x1cm firmness palpated on lateral aspect of right lower leg, no overlying erythema, edema, or other skin changes. No peripheral edema. No homan's sign.  Psych: Pleasant and appropriate   ASSESSMENT/PLAN:   Leg lesion Patient has 2cm x 1cm area of firmness on lateral aspect of right lower leg, about 2-3cm distal to joint line. DDx is broad as there is no overlying skin changes, swelling, etc. Considered lipoma, but it is firmer and not as mobile and less discrete. I also considered DVT, but without swelling or other symptoms or increased risk factors, this is less likely as well. Most likely suspect some superficial phlebitis or vasculitis. Recommend using warm compress to help reduce discomfort and size. Also can try NSAIDs 600 mg q6-8 h for 3-4 days. Precepted with Dr. Deirdre Priest. Return precautions discussed.     Shirlean Mylar, MD [facility]

## 2021-04-21 NOTE — Assessment & Plan Note (Signed)
Patient has 2cm x 1cm area of firmness on lateral aspect of right lower leg, about 2-3cm distal to joint line. DDx is broad as there is no overlying skin changes, swelling, etc. Considered lipoma, but it is firmer and not as mobile and less discrete. I also considered DVT, but without swelling or other symptoms or increased risk factors, this is less likely as well. Most likely suspect some superficial phlebitis or vasculitis. Recommend using warm compress to help reduce discomfort and size. Also can try NSAIDs 600 mg q6-8 h for 3-4 days. Precepted with Dr. Deirdre Priest. Return precautions discussed.

## 2022-04-10 ENCOUNTER — Encounter: Payer: Self-pay | Admitting: *Deleted
